# Patient Record
Sex: Female | Born: 1950 | Race: White | Hispanic: No | Marital: Married | Smoking: Never smoker
Health system: Southern US, Community
[De-identification: ages and names within clinical notes are randomized; demographics above are authoritative.]

## PROBLEM LIST (undated history)

## (undated) DIAGNOSIS — Z923 Personal history of irradiation: Secondary | ICD-10-CM

## (undated) DIAGNOSIS — C50919 Malignant neoplasm of unspecified site of unspecified female breast: Secondary | ICD-10-CM

## (undated) DIAGNOSIS — Z9221 Personal history of antineoplastic chemotherapy: Secondary | ICD-10-CM

## (undated) HISTORY — PX: HERNIA REPAIR: SHX51

## (undated) HISTORY — PX: CATARACT EXTRACTION: SUR2

## (undated) HISTORY — PX: BREAST EXCISIONAL BIOPSY: SUR124

## (undated) HISTORY — PX: ABDOMINAL HYSTERECTOMY: SHX81

---

## 1997-11-18 ENCOUNTER — Other Ambulatory Visit: Admission: RE | Admit: 1997-11-18 | Discharge: 1997-11-18 | Payer: Self-pay | Admitting: *Deleted

## 2000-04-04 ENCOUNTER — Other Ambulatory Visit: Admission: RE | Admit: 2000-04-04 | Discharge: 2000-04-04 | Payer: Self-pay | Admitting: Family Medicine

## 2000-04-25 ENCOUNTER — Ambulatory Visit (HOSPITAL_COMMUNITY): Admission: RE | Admit: 2000-04-25 | Discharge: 2000-04-25 | Payer: Self-pay | Admitting: *Deleted

## 2001-01-14 DIAGNOSIS — Z923 Personal history of irradiation: Secondary | ICD-10-CM

## 2001-01-14 DIAGNOSIS — Z9221 Personal history of antineoplastic chemotherapy: Secondary | ICD-10-CM

## 2001-01-14 HISTORY — DX: Personal history of antineoplastic chemotherapy: Z92.21

## 2001-01-14 HISTORY — DX: Personal history of irradiation: Z92.3

## 2001-07-08 ENCOUNTER — Other Ambulatory Visit: Admission: RE | Admit: 2001-07-08 | Discharge: 2001-07-08 | Payer: Self-pay | Admitting: *Deleted

## 2001-07-10 ENCOUNTER — Encounter: Payer: Self-pay | Admitting: Family Medicine

## 2001-07-10 ENCOUNTER — Encounter: Admission: RE | Admit: 2001-07-10 | Discharge: 2001-07-10 | Payer: Self-pay | Admitting: Family Medicine

## 2001-07-10 ENCOUNTER — Encounter (INDEPENDENT_AMBULATORY_CARE_PROVIDER_SITE_OTHER): Payer: Self-pay | Admitting: Specialist

## 2001-07-10 HISTORY — PX: BREAST BIOPSY: SHX20

## 2001-07-20 ENCOUNTER — Ambulatory Visit (HOSPITAL_BASED_OUTPATIENT_CLINIC_OR_DEPARTMENT_OTHER): Admission: RE | Admit: 2001-07-20 | Discharge: 2001-07-20 | Payer: Self-pay | Admitting: *Deleted

## 2001-07-20 ENCOUNTER — Encounter: Admission: RE | Admit: 2001-07-20 | Discharge: 2001-07-20 | Payer: Self-pay | Admitting: *Deleted

## 2001-07-20 ENCOUNTER — Encounter (INDEPENDENT_AMBULATORY_CARE_PROVIDER_SITE_OTHER): Payer: Self-pay | Admitting: Specialist

## 2001-07-20 DIAGNOSIS — C50919 Malignant neoplasm of unspecified site of unspecified female breast: Secondary | ICD-10-CM

## 2001-07-20 HISTORY — PX: BREAST LUMPECTOMY: SHX2

## 2001-07-20 HISTORY — DX: Malignant neoplasm of unspecified site of unspecified female breast: C50.919

## 2001-08-05 ENCOUNTER — Ambulatory Visit: Admission: RE | Admit: 2001-08-05 | Discharge: 2001-11-03 | Payer: Self-pay | Admitting: Radiation Oncology

## 2001-11-10 ENCOUNTER — Ambulatory Visit: Admission: RE | Admit: 2001-11-10 | Discharge: 2002-01-25 | Payer: Self-pay | Admitting: Radiation Oncology

## 2001-11-16 ENCOUNTER — Encounter: Payer: Self-pay | Admitting: Family Medicine

## 2001-11-16 ENCOUNTER — Encounter: Admission: RE | Admit: 2001-11-16 | Discharge: 2001-11-16 | Payer: Self-pay | Admitting: Family Medicine

## 2002-01-14 HISTORY — PX: BREAST BIOPSY: SHX20

## 2002-03-22 ENCOUNTER — Ambulatory Visit (HOSPITAL_BASED_OUTPATIENT_CLINIC_OR_DEPARTMENT_OTHER): Admission: RE | Admit: 2002-03-22 | Discharge: 2002-03-22 | Payer: Self-pay | Admitting: *Deleted

## 2002-03-22 ENCOUNTER — Encounter (INDEPENDENT_AMBULATORY_CARE_PROVIDER_SITE_OTHER): Payer: Self-pay | Admitting: *Deleted

## 2002-04-26 ENCOUNTER — Ambulatory Visit (HOSPITAL_BASED_OUTPATIENT_CLINIC_OR_DEPARTMENT_OTHER): Admission: RE | Admit: 2002-04-26 | Discharge: 2002-04-26 | Payer: Self-pay | Admitting: *Deleted

## 2002-04-27 ENCOUNTER — Encounter (INDEPENDENT_AMBULATORY_CARE_PROVIDER_SITE_OTHER): Payer: Self-pay | Admitting: *Deleted

## 2002-07-30 ENCOUNTER — Other Ambulatory Visit: Admission: RE | Admit: 2002-07-30 | Discharge: 2002-07-30 | Payer: Self-pay | Admitting: Internal Medicine

## 2002-11-19 ENCOUNTER — Encounter: Admission: RE | Admit: 2002-11-19 | Discharge: 2002-11-19 | Payer: Self-pay | Admitting: Oncology

## 2003-01-27 ENCOUNTER — Ambulatory Visit: Admission: RE | Admit: 2003-01-27 | Discharge: 2003-01-27 | Payer: Self-pay | Admitting: Radiation Oncology

## 2003-09-23 ENCOUNTER — Other Ambulatory Visit: Admission: RE | Admit: 2003-09-23 | Discharge: 2003-09-23 | Payer: Self-pay | Admitting: Internal Medicine

## 2003-11-25 ENCOUNTER — Encounter: Admission: RE | Admit: 2003-11-25 | Discharge: 2003-11-25 | Payer: Self-pay | Admitting: Internal Medicine

## 2004-02-16 HISTORY — PX: BREAST BIOPSY: SHX20

## 2004-03-09 ENCOUNTER — Ambulatory Visit (HOSPITAL_COMMUNITY): Admission: RE | Admit: 2004-03-09 | Discharge: 2004-03-09 | Payer: Self-pay | Admitting: *Deleted

## 2004-03-09 ENCOUNTER — Ambulatory Visit (HOSPITAL_BASED_OUTPATIENT_CLINIC_OR_DEPARTMENT_OTHER): Admission: RE | Admit: 2004-03-09 | Discharge: 2004-03-09 | Payer: Self-pay | Admitting: *Deleted

## 2004-03-09 ENCOUNTER — Encounter (INDEPENDENT_AMBULATORY_CARE_PROVIDER_SITE_OTHER): Payer: Self-pay | Admitting: Specialist

## 2004-05-02 ENCOUNTER — Ambulatory Visit: Payer: Self-pay | Admitting: Oncology

## 2004-11-06 ENCOUNTER — Ambulatory Visit: Payer: Self-pay | Admitting: Oncology

## 2004-11-28 ENCOUNTER — Encounter: Admission: RE | Admit: 2004-11-28 | Discharge: 2004-11-28 | Payer: Self-pay | Admitting: Internal Medicine

## 2004-12-14 ENCOUNTER — Other Ambulatory Visit: Admission: RE | Admit: 2004-12-14 | Discharge: 2004-12-14 | Payer: Self-pay | Admitting: Gynecology

## 2005-05-01 ENCOUNTER — Ambulatory Visit: Payer: Self-pay | Admitting: Oncology

## 2005-05-02 LAB — COMPREHENSIVE METABOLIC PANEL
ALT: 15 U/L (ref 0–40)
AST: 19 U/L (ref 0–37)
Albumin: 4.4 g/dL (ref 3.5–5.2)
Alkaline Phosphatase: 44 U/L (ref 39–117)
BUN: 22 mg/dL (ref 6–23)
CO2: 26 mEq/L (ref 19–32)
Calcium: 9.3 mg/dL (ref 8.4–10.5)
Chloride: 103 mEq/L (ref 96–112)
Creatinine, Ser: 0.7 mg/dL (ref 0.4–1.2)
Glucose, Bld: 92 mg/dL (ref 70–99)
Potassium: 4.1 mEq/L (ref 3.5–5.3)
Sodium: 138 mEq/L (ref 135–145)
Total Bilirubin: 0.6 mg/dL (ref 0.3–1.2)
Total Protein: 7.7 g/dL (ref 6.0–8.3)

## 2005-05-02 LAB — CBC WITH DIFFERENTIAL/PLATELET
BASO%: 0.4 % (ref 0.0–2.0)
Basophils Absolute: 0 10*3/uL (ref 0.0–0.1)
EOS%: 0.6 % (ref 0.0–7.0)
Eosinophils Absolute: 0 10*3/uL (ref 0.0–0.5)
HCT: 40.4 % (ref 34.8–46.6)
HGB: 13.4 g/dL (ref 11.6–15.9)
LYMPH%: 21.1 % (ref 14.0–48.0)
MCH: 29.5 pg (ref 26.0–34.0)
MCHC: 33.2 g/dL (ref 32.0–36.0)
MCV: 88.8 fL (ref 81.0–101.0)
MONO#: 0.5 10*3/uL (ref 0.1–0.9)
MONO%: 6 % (ref 0.0–13.0)
NEUT#: 5.4 10*3/uL (ref 1.5–6.5)
NEUT%: 71.9 % (ref 39.6–76.8)
Platelets: 306 10*3/uL (ref 145–400)
RBC: 4.55 10*6/uL (ref 3.70–5.32)
RDW: 14.8 % — ABNORMAL HIGH (ref 11.3–14.5)
WBC: 7.5 10*3/uL (ref 3.9–10.0)
lymph#: 1.6 10*3/uL (ref 0.9–3.3)

## 2005-07-24 ENCOUNTER — Inpatient Hospital Stay (HOSPITAL_COMMUNITY): Admission: RE | Admit: 2005-07-24 | Discharge: 2005-07-26 | Payer: Self-pay | Admitting: Obstetrics and Gynecology

## 2005-07-24 ENCOUNTER — Encounter (INDEPENDENT_AMBULATORY_CARE_PROVIDER_SITE_OTHER): Payer: Self-pay | Admitting: Specialist

## 2005-11-05 ENCOUNTER — Ambulatory Visit: Payer: Self-pay | Admitting: Oncology

## 2005-11-29 ENCOUNTER — Encounter: Admission: RE | Admit: 2005-11-29 | Discharge: 2005-11-29 | Payer: Self-pay | Admitting: Oncology

## 2006-05-02 ENCOUNTER — Ambulatory Visit: Payer: Self-pay | Admitting: Oncology

## 2006-05-07 LAB — COMPREHENSIVE METABOLIC PANEL
AST: 25 U/L (ref 0–37)
Albumin: 4.3 g/dL (ref 3.5–5.2)
Alkaline Phosphatase: 43 U/L (ref 39–117)
BUN: 16 mg/dL (ref 6–23)
Creatinine, Ser: 0.71 mg/dL (ref 0.40–1.20)
Glucose, Bld: 84 mg/dL (ref 70–99)
Potassium: 4.1 mEq/L (ref 3.5–5.3)
Total Bilirubin: 0.3 mg/dL (ref 0.3–1.2)

## 2006-05-07 LAB — CBC WITH DIFFERENTIAL/PLATELET
Basophils Absolute: 0 10*3/uL (ref 0.0–0.1)
EOS%: 3.2 % (ref 0.0–7.0)
Eosinophils Absolute: 0.2 10*3/uL (ref 0.0–0.5)
HCT: 36.2 % (ref 34.8–46.6)
HGB: 12.5 g/dL (ref 11.6–15.9)
LYMPH%: 27.9 % (ref 14.0–48.0)
MCH: 30.1 pg (ref 26.0–34.0)
MCV: 87.6 fL (ref 81.0–101.0)
MONO%: 6.9 % (ref 0.0–13.0)
NEUT#: 3.8 10*3/uL (ref 1.5–6.5)
NEUT%: 61.3 % (ref 39.6–76.8)
Platelets: 262 10*3/uL (ref 145–400)
RDW: 14.5 % (ref 11.3–14.5)

## 2006-11-11 ENCOUNTER — Ambulatory Visit: Payer: Self-pay | Admitting: Oncology

## 2006-11-13 LAB — CBC WITH DIFFERENTIAL/PLATELET
BASO%: 0.4 % (ref 0.0–2.0)
Basophils Absolute: 0 10e3/uL (ref 0.0–0.1)
EOS%: 1.5 % (ref 0.0–7.0)
Eosinophils Absolute: 0.1 10e3/uL (ref 0.0–0.5)
HCT: 37.7 % (ref 34.8–46.6)
HGB: 13.1 g/dL (ref 11.6–15.9)
LYMPH%: 22.5 % (ref 14.0–48.0)
MCH: 30.3 pg (ref 26.0–34.0)
MCHC: 34.7 g/dL (ref 32.0–36.0)
MCV: 87.4 fL (ref 81.0–101.0)
MONO#: 0.6 10e3/uL (ref 0.1–0.9)
MONO%: 9.2 % (ref 0.0–13.0)
NEUT#: 4.3 10e3/uL (ref 1.5–6.5)
NEUT%: 66.4 % (ref 39.6–76.8)
Platelets: 259 10e3/uL (ref 145–400)
RBC: 4.31 10e6/uL (ref 3.70–5.32)
RDW: 14.4 % (ref 11.3–14.5)
WBC: 6.5 10e3/uL (ref 3.9–10.0)
lymph#: 1.5 10e3/uL (ref 0.9–3.3)

## 2006-11-13 LAB — COMPREHENSIVE METABOLIC PANEL WITH GFR
ALT: 20 U/L (ref 0–35)
AST: 24 U/L (ref 0–37)
Albumin: 4.2 g/dL (ref 3.5–5.2)
Alkaline Phosphatase: 38 U/L — ABNORMAL LOW (ref 39–117)
BUN: 16 mg/dL (ref 6–23)
CO2: 24 meq/L (ref 19–32)
Calcium: 9.5 mg/dL (ref 8.4–10.5)
Chloride: 102 meq/L (ref 96–112)
Creatinine, Ser: 0.69 mg/dL (ref 0.40–1.20)
Glucose, Bld: 82 mg/dL (ref 70–99)
Potassium: 4.2 meq/L (ref 3.5–5.3)
Sodium: 138 meq/L (ref 135–145)
Total Bilirubin: 0.5 mg/dL (ref 0.3–1.2)
Total Protein: 7.4 g/dL (ref 6.0–8.3)

## 2006-12-03 ENCOUNTER — Encounter: Admission: RE | Admit: 2006-12-03 | Discharge: 2006-12-03 | Payer: Self-pay | Admitting: Oncology

## 2007-05-11 ENCOUNTER — Ambulatory Visit: Payer: Self-pay | Admitting: Oncology

## 2007-05-13 LAB — COMPREHENSIVE METABOLIC PANEL
ALT: 45 U/L — ABNORMAL HIGH (ref 0–35)
AST: 44 U/L — ABNORMAL HIGH (ref 0–37)
Albumin: 4.3 g/dL (ref 3.5–5.2)
Alkaline Phosphatase: 49 U/L (ref 39–117)
BUN: 23 mg/dL (ref 6–23)
CO2: 27 mEq/L (ref 19–32)
Calcium: 10 mg/dL (ref 8.4–10.5)
Chloride: 105 mEq/L (ref 96–112)
Creatinine, Ser: 0.72 mg/dL (ref 0.40–1.20)
Glucose, Bld: 82 mg/dL (ref 70–99)
Potassium: 4.1 mEq/L (ref 3.5–5.3)
Sodium: 141 mEq/L (ref 135–145)
Total Bilirubin: 0.4 mg/dL (ref 0.3–1.2)
Total Protein: 7.5 g/dL (ref 6.0–8.3)

## 2007-05-13 LAB — CBC WITH DIFFERENTIAL/PLATELET
BASO%: 0.3 % (ref 0.0–2.0)
Basophils Absolute: 0 10*3/uL (ref 0.0–0.1)
EOS%: 4.9 % (ref 0.0–7.0)
Eosinophils Absolute: 0.3 10*3/uL (ref 0.0–0.5)
HCT: 36.6 % (ref 34.8–46.6)
HGB: 12.6 g/dL (ref 11.6–15.9)
LYMPH%: 28.3 % (ref 14.0–48.0)
MCH: 30.1 pg (ref 26.0–34.0)
MCHC: 34.5 g/dL (ref 32.0–36.0)
MCV: 87.2 fL (ref 81.0–101.0)
MONO#: 0.4 10*3/uL (ref 0.1–0.9)
MONO%: 6.6 % (ref 0.0–13.0)
NEUT#: 3.8 10*3/uL (ref 1.5–6.5)
NEUT%: 59.9 % (ref 39.6–76.8)
Platelets: 312 10*3/uL (ref 145–400)
RBC: 4.2 10*6/uL (ref 3.70–5.32)
RDW: 14.7 % — ABNORMAL HIGH (ref 11.3–14.5)
WBC: 6.3 10*3/uL (ref 3.9–10.0)
lymph#: 1.8 10*3/uL (ref 0.9–3.3)

## 2007-12-04 ENCOUNTER — Encounter: Admission: RE | Admit: 2007-12-04 | Discharge: 2007-12-04 | Payer: Self-pay | Admitting: Family Medicine

## 2008-02-03 ENCOUNTER — Encounter: Admission: RE | Admit: 2008-02-03 | Discharge: 2008-02-03 | Payer: Self-pay | Admitting: Family Medicine

## 2008-06-14 ENCOUNTER — Ambulatory Visit: Payer: Self-pay | Admitting: Oncology

## 2008-06-16 LAB — COMPREHENSIVE METABOLIC PANEL
ALT: 21 U/L (ref 0–35)
AST: 24 U/L (ref 0–37)
Albumin: 4.2 g/dL (ref 3.5–5.2)
Alkaline Phosphatase: 50 U/L (ref 39–117)
BUN: 17 mg/dL (ref 6–23)
CO2: 27 mEq/L (ref 19–32)
Calcium: 9.3 mg/dL (ref 8.4–10.5)
Chloride: 104 mEq/L (ref 96–112)
Creatinine, Ser: 0.74 mg/dL (ref 0.40–1.20)
Glucose, Bld: 82 mg/dL (ref 70–99)
Potassium: 4.5 mEq/L (ref 3.5–5.3)
Sodium: 139 mEq/L (ref 135–145)
Total Bilirubin: 0.6 mg/dL (ref 0.3–1.2)
Total Protein: 7.4 g/dL (ref 6.0–8.3)

## 2008-06-16 LAB — CBC WITH DIFFERENTIAL/PLATELET
BASO%: 1 % (ref 0.0–2.0)
Basophils Absolute: 0.1 10*3/uL (ref 0.0–0.1)
EOS%: 2.9 % (ref 0.0–7.0)
Eosinophils Absolute: 0.2 10*3/uL (ref 0.0–0.5)
HCT: 40.9 % (ref 34.8–46.6)
HGB: 13.8 g/dL (ref 11.6–15.9)
LYMPH%: 31.6 % (ref 14.0–49.7)
MCH: 29.4 pg (ref 25.1–34.0)
MCHC: 33.7 g/dL (ref 31.5–36.0)
MCV: 87 fL (ref 79.5–101.0)
MONO#: 0.5 10*3/uL (ref 0.1–0.9)
MONO%: 8.8 % (ref 0.0–14.0)
NEUT#: 3.3 10*3/uL (ref 1.5–6.5)
NEUT%: 55.7 % (ref 38.4–76.8)
Platelets: 242 10*3/uL (ref 145–400)
RBC: 4.7 10*6/uL (ref 3.70–5.45)
RDW: 14.6 % — ABNORMAL HIGH (ref 11.2–14.5)
WBC: 5.9 10*3/uL (ref 3.9–10.3)
lymph#: 1.9 10*3/uL (ref 0.9–3.3)

## 2008-12-07 ENCOUNTER — Encounter: Admission: RE | Admit: 2008-12-07 | Discharge: 2008-12-07 | Payer: Self-pay | Admitting: Family Medicine

## 2009-08-02 ENCOUNTER — Ambulatory Visit: Payer: Self-pay | Admitting: Oncology

## 2009-12-08 ENCOUNTER — Encounter: Admission: RE | Admit: 2009-12-08 | Discharge: 2009-12-08 | Payer: Self-pay | Admitting: Family Medicine

## 2010-03-23 ENCOUNTER — Other Ambulatory Visit: Payer: Self-pay | Admitting: Gastroenterology

## 2010-06-01 NOTE — Op Note (Signed)
Teresa Terrell, [name]:  [id number]   MEDICAL RECORD NO.:  [id number]          PATIENT TYPE:  INP   LOCATION:  9399                          FACILITY:  [facility]   PHYSICIAN:  Gerald Leitz, MD          DATE OF BIRTH:  1950-05-21   DATE OF PROCEDURE:  07/24/2005  DATE OF DISCHARGE:                                 OPERATIVE REPORT   PREOPERATIVE DIAGNOSIS:  Complex atypical hyperplasia.   POSTOPERATIVE DIAGNOSIS:  Complex atypical hyperplasia.   PROCEDURE:  Total abdominal hysterectomy and bilateral salpingo-  oophorectomy.   SURGEON:  Gerald Leitz, MD   ASSISTANT:  Bing Neighbors. Delcambre, MD   ANESTHESIA:  General.   SPECIMEN:  Uterus, right and left fallopian tube and right and left ovary.  Sent to pathology   ESTIMATED BLOOD LOSS:  250 mL.   FLUIDS:  1800 100 mL of lactated Ringer's.   URINE OUTPUT:  225 mL of clear urine at the end of the procedure.   COMPLICATIONS:  None.   FINDINGS:  An 8 cm fibroid uterus.  Fallopian tubes appeared normal.  Patient with mesh from previous inguinal hernia repair noted in the right  lower quadrant involving the fascia.  Adhesions of the broad ligament to the  colon.   PROCEDURE:  The patient was taken to the operating room, where she was  placed under general anesthesia.  She was ten prepped and draped in the  usual sterile fashion.  A Pfannenstiel skin incision was made with the  scalpel and carried down to the underlying layer of fascia.  The fascia was  noted in the right lower quadrant to be compromised by mesh from a previous  inguinal hernia repair.  Incision in the fascia was made superior to the  mesh from hernia repair.  The incision was extended laterally with Mayo  scissors.  The inferior aspect of the fascial incision was grasped with  Kocher clamps, elevated, and the underlying rectus muscles were dissected  off.  The rectus muscles were separated in the midline.  The peritoneum was  identified and entered  sharply with Metzenbaum scissors and the peritoneal  incision was extended superiorly with Metzenbaum scissors and inferiorly  with good visualization of the bladder.  The abdomen was inspected with the  findings noted above.  A Balfour retractor was placed into the surgical  incision.  The bowel was packed away with moistened laparotomy sponges.  The  uterus was grasped at the cornua with Kelly clamps for traction.  The broad  ligaments were identified.  They were sutured with 0 Vicryl and then  ligated.  The broad ligament was entered with Metzenbaum scissors.  The  bladder flap was created with Metzenbaum scissors.  The bladder was  dissected off the lower uterine segment via a series of sharp and blunt  dissection.  The infundibulopelvic ligaments were identified bilaterally.  They were clamped with Heaney clamps and transected and then suture ligated  with 0 Vicryl.  Excellent hemostasis was noted.  The uterine arteries were  skeletonized  bilaterally.  Heaney clamps were applied to the uterine  arteries.  They were transected and suture ligated with 0 Vicryl.  The  cardinal ligaments and uterosacral ligaments were transected and suture  ligated with 0 Vicryl.  The uterus and cervix were amputated with Mayo  scissors.  The vaginal cuff angles were repaired with 0 Vicryl.  The vaginal  cuff was closed with running locked stitches of - Vicryl.  Excellent  hemostasis was noted.  All instruments were removed from the patient's  abdomen along with sponges.  The fascia was closed with 0 PDS in a running  fashion.  Scarpa fascia was reapproximated with a 2-0 Vicryl in interrupted  fashion.  The skin was closed with 4-0 Vicryl in a running fashion.  Sponge,  lap and needle counts were correct x2.  Ancef 2 g was given at the beginning  of the procedure.  The patient was awakened from anesthesia and taken to  recovery room in stable condition.      Gerald Leitz, MD  Electronically  Signed     TC/MEDQ  D:  07/24/2005  T:  07/24/2005  Job:  [id number]

## 2010-06-01 NOTE — H&P (Signed)
Teresa Terrell, Teresa [name]:  [id number]   MEDICAL RECORD NO.:  [id number]          PATIENT TYPE:  AMB   LOCATION:  SDC                           FACILITY:  [facility]   PHYSICIAN:  Gerald Leitz, MD          DATE OF BIRTH:  14-Oct-1950   DATE OF ADMISSION:  07/24/2005  DATE OF DISCHARGE:                                HISTORY & PHYSICAL   HISTORY OF PRESENT ILLNESS:  This is a 60 year old G4, P4 with complex  atypical endometrial hyperplasia on endometrial biopsy performed May 29, 2005.  The patient had been on tamoxifen therapy for the past 3 years  secondary to history of breast cancer diagnosed in July 2003.  She began  having abnormal uterine bleeding and underwent an endometrial biopsy in the  office May 29, 2005.   PAST OBSTETRICAL/GYNECOLOGICAL HISTORY:  _spontaneouus_ vaginal delivery x4.  No history of sexually transmitted diseases.  No history of abnormal Pap  smears.  Last Pap smear was on December 14, 2004 and this was normal.   PAST MEDICAL HISTORY:  Hypertension, plantar fasciitis, osteopenia, and  anemia.   PAST SURGICAL HISTORY:  Left open inguinal hernia repair.  Left breast  biopsy.  Right breast lumpectomy.  Umbilical hernia repair.  Laparoscopic  right inguinal hernia repair in 1986.   MEDICATIONS:  1.  Norvasc 5 mg one p.o. daily.  2.  Fosamax Plus one weekly.  3.  Feosol one daily.  4.  Calcium plus D one a day.  5.  Vitamin E 400 international units one daily.   ALLERGIES:  NO KNOWN DRUG ALLERGIES.   SOCIAL HISTORY:  The patient is married.  Denies tobacco, alcohol, or  illicit drug use.   FAMILY HISTORY:  Negative for breast, ovarian, and colon cancer; however,  the patient has a history of breast cancer.   PHYSICAL EXAMINATION:  VITAL SIGNS:  Blood pressure 116/82, heart rate 72,  weight 138-1/2 pounds, height is 5 feet 7/8 inch.  CARDIOVASCULAR:  Regular rate and rhythm.  LUNGS:  Clear to auscultation bilaterally.  ABDOMEN:  Soft,  nontender, nondistended.  No masses.  PELVIC EXAM:  Normal external female genitalia.  No vulva, vaginal, cervical  lesions noted.  Bimanual exam reveals right adnexal fullness.  Questionable  mass-like structure the patient states has been there for some time since  inguinal hernia repair.  No left adnexal masses or tenderness.   PATHOLOGY:  Endometrial biopsy shows atypical complex hyperplasia.   ASSESSMENT AND PLAN:  Atypical complex hyperplasia.  Given 30% risk of  progression to endometrial carcinoma, the patient was offered total  abdominal hysterectomy.  Given history of umbilical hernia repair as well as  left open inguinal hernia repair, she desires to have her ovaries removed  during the hysterectomy.  We will proceed with total abdominal hysterectomy  and bilateral salpingo-oophorectomy.  Risks, benefits, alternatives of  surgery were discussed with the patient including, but not limited to,  infection, bleeding, damage to bowel, bladder and surrounding organs with  the need for further surgery.  Risk of deep vein thrombosis and risk of  transfusion, human immunodeficiency virus, hepatitis B and C were also  discussed.  The patient understands and wishes to proceed with total  abdominal hysterectomy and bilateral salpingo-oophorectomy.      Gerald Leitz, MD  Electronically Signed     TC/MEDQ  D:  07/22/2005  T:  07/22/2005  Job:  [id number]

## 2010-06-01 NOTE — Discharge Summary (Signed)
NAMEMANASA, SPEASE NO.:  [id number]   MEDICAL RECORD NO.:  [id number]          PATIENT TYPE:  INP   LOCATION:  9308                          FACILITY:  [facility]   PHYSICIAN:  Gerald Leitz, MD          DATE OF BIRTH:  [birth year]   DATE OF ADMISSION:  07/24/2005  DATE OF DISCHARGE:  07/26/2005                                 DISCHARGE SUMMARY   DIAGNOSIS AT ADMISSION:  Complex atypical hyperplasia.   DISCHARGE DIAGNOSES:  1. Complex atypical hyperplasia.  2. Status post total abdominal hysterectomy and bilateral salpingo-      oophorectomy.   BRIEF HOSPITAL COURSE:  The patient was admitted on July 24, 2005, after  undergoing a total abdominal hysterectomy and bilateral salpingo-  oophorectomy secondary to complex atypical hyperplasia.  She did well  postoperatively.  Hemoglobin on postoperative day #1 was 12.2.  She was  discharged home in stable condition.  Activity ad lib.  Pelvic rest for 6  weeks.  She was discharged home on the following medications:  Motrin and Percocet.  She will follow up with Dr. Richardson Dopp in 2 weeks.      Gerald Leitz, MD  Electronically Signed     TC/MEDQ  D:  09/03/2005  T:  09/03/2005  Job:  [id number]

## 2010-08-15 ENCOUNTER — Encounter (HOSPITAL_BASED_OUTPATIENT_CLINIC_OR_DEPARTMENT_OTHER): Payer: BC Managed Care – PPO | Admitting: Oncology

## 2010-08-15 DIAGNOSIS — C50419 Malignant neoplasm of upper-outer quadrant of unspecified female breast: Secondary | ICD-10-CM

## 2010-08-15 DIAGNOSIS — Z170 Estrogen receptor positive status [ER+]: Secondary | ICD-10-CM

## 2010-11-13 ENCOUNTER — Other Ambulatory Visit: Payer: Self-pay | Admitting: Family Medicine

## 2010-11-13 DIAGNOSIS — Z1231 Encounter for screening mammogram for malignant neoplasm of breast: Secondary | ICD-10-CM

## 2010-12-12 ENCOUNTER — Ambulatory Visit
Admission: RE | Admit: 2010-12-12 | Discharge: 2010-12-12 | Disposition: A | Discharge status: Home / Self Care | Payer: BC Managed Care – PPO | Source: Ambulatory Visit | Attending: Family Medicine | Admitting: Family Medicine

## 2010-12-12 DIAGNOSIS — Z1231 Encounter for screening mammogram for malignant neoplasm of breast: Secondary | ICD-10-CM

## 2011-01-25 ENCOUNTER — Emergency Department (HOSPITAL_BASED_OUTPATIENT_CLINIC_OR_DEPARTMENT_OTHER)
Admission: EM | Admit: 2011-01-25 | Discharge: 2011-01-25 | Disposition: A | Discharge status: Home / Self Care | Payer: BC Managed Care – PPO | Attending: Emergency Medicine | Admitting: Emergency Medicine

## 2011-01-25 ENCOUNTER — Emergency Department (INDEPENDENT_AMBULATORY_CARE_PROVIDER_SITE_OTHER): Payer: BC Managed Care – PPO

## 2011-01-25 ENCOUNTER — Encounter (HOSPITAL_BASED_OUTPATIENT_CLINIC_OR_DEPARTMENT_OTHER): Payer: Self-pay | Admitting: *Deleted

## 2011-01-25 DIAGNOSIS — Y92009 Unspecified place in unspecified non-institutional (private) residence as the place of occurrence of the external cause: Secondary | ICD-10-CM | POA: Insufficient documentation

## 2011-01-25 DIAGNOSIS — M7989 Other specified soft tissue disorders: Secondary | ICD-10-CM

## 2011-01-25 DIAGNOSIS — S82899A Other fracture of unspecified lower leg, initial encounter for closed fracture: Secondary | ICD-10-CM

## 2011-01-25 DIAGNOSIS — W19XXXA Unspecified fall, initial encounter: Secondary | ICD-10-CM

## 2011-01-25 DIAGNOSIS — X500XXA Overexertion from strenuous movement or load, initial encounter: Secondary | ICD-10-CM | POA: Insufficient documentation

## 2011-01-25 DIAGNOSIS — M79609 Pain in unspecified limb: Secondary | ICD-10-CM

## 2011-01-25 DIAGNOSIS — S82839A Other fracture of upper and lower end of unspecified fibula, initial encounter for closed fracture: Secondary | ICD-10-CM

## 2011-01-25 NOTE — ED Provider Notes (Signed)
History     CSN: [id number]  Arrival date & time 01/25/11  0840   First MD Initiated Contact with Patient 01/25/11 [phone]      Chief Complaint  Patient presents with  . Ankle Pain    (Consider location/radiation/quality/duration/timing/severity/associated sxs/prior treatment) Patient is a 61 y.o. female presenting with ankle pain. The history is provided by the patient.  Ankle Pain  The incident occurred 1 to 2 hours ago. The incident occurred at home. Pertinent negatives include no numbness.   patient stepped off a last up and twisted her right ankle. She has pain laterally. No other injury. No numbness weakness. She states she only has pain when she walks on it. Skin is intact  History reviewed. No pertinent past medical history.  History reviewed. No pertinent past surgical history.  History reviewed. No pertinent family history.  History  Substance Use Topics  . Smoking status: Not on file  . Smokeless tobacco: Not on file  . Alcohol Use: Not on file    OB History    Grav Para Term Preterm Abortions TAB SAB Ect Mult Living                  Review of Systems  Respiratory: Negative for shortness of breath.   Cardiovascular: Negative for chest pain.  Musculoskeletal: Positive for joint swelling and gait problem. Negative for back pain.  Skin: Negative for pallor, rash and wound.  Neurological: Negative for weakness and numbness.    Allergies  Review of patient's allergies indicates no known allergies.  Home Medications  No current outpatient prescriptions on file.  BP 150/87  Pulse 92  Temp(Src) 98.2 F (36.8 C) (Oral)  Ht 5' (1.524 m)  Wt 136 lb (61.689 kg)  BMI 26.56 kg/m2  SpO2 98%  Physical Exam  Constitutional: She appears well-developed.  Musculoskeletal:       Tenderness and swelling over lateral malleolus of right ankle. Skin is intact. No tenderness over fifth metacarpal. No tenderness over the fibula. Dorsalis pedis pulse intact.    Neurological: She is alert.  Skin: Skin is warm and dry. No erythema.    ED Course  Procedures (including critical care time)  Labs Reviewed - No data to display Dg Ankle Complete Right  01/25/2011  *RADIOLOGY REPORT*  Clinical Data: Fall, pain and swelling  RIGHT ANKLE - COMPLETE 3+ VIEW  Comparison: None.  Findings: Acute transverse fracture with minimal displacement of the right lateral malleolus.  Distal tibia, talus and calcaneus intact.  Plantar calcaneal spurring evident.  Lateral swelling noted.  IMPRESSION: Acute fracture right lateral malleolus.  Original Report Authenticated By: Judie Petit. Ruel Favors, M.D.     1. Fracture of distal fibula       MDM  Right ankle injury. Distal fibular fracture. Mortise appears intact. No tenderness over the fibula. Patient was put in a Cam Walker he'll followup with sports medicine or orthopedics.        Juliet Rude. Rubin Payor, MD 01/25/11 [phone]

## 2011-01-25 NOTE — ED Notes (Signed)
Pt to room 6 in w/c, reports missing last step and twisting her left ankle this am. Denies any other injuries or head trauma.

## 2011-01-31 ENCOUNTER — Ambulatory Visit (INDEPENDENT_AMBULATORY_CARE_PROVIDER_SITE_OTHER): Payer: BC Managed Care – PPO | Admitting: Family Medicine

## 2011-01-31 ENCOUNTER — Encounter: Payer: Self-pay | Admitting: Family Medicine

## 2011-01-31 VITALS — BP 149/93 | HR 71 | Temp 98.1°F | Ht 60.0 in | Wt 136.0 lb

## 2011-01-31 DIAGNOSIS — S99911A Unspecified injury of right ankle, initial encounter: Secondary | ICD-10-CM

## 2011-01-31 DIAGNOSIS — S99919A Unspecified injury of unspecified ankle, initial encounter: Secondary | ICD-10-CM

## 2011-01-31 NOTE — Patient Instructions (Signed)
You have a distal fibula avulsion fracture. These heal up really well with conservative therapy. Wear the walking boot when up and walking around at all times - ok to take off to ice, elevate, and shower only but be careful. Ice for 15 minutes at a time 3-4 times a day. ACE wrap for compression from toes up to mid-calf. Elevation above heart to help with swelling as well. Follow up with me in 2 weeks - we will repeat x-rays. Anticipate this taking about 6 weeks to totally heal clinically.

## 2011-02-01 ENCOUNTER — Encounter: Payer: Self-pay | Admitting: Family Medicine

## 2011-02-01 DIAGNOSIS — S99911A Unspecified injury of right ankle, initial encounter: Secondary | ICD-10-CM | POA: Insufficient documentation

## 2011-02-01 NOTE — Assessment & Plan Note (Signed)
Right ankle distal fibula avulsion fracture - Weber A type - should heal well with conservative care, walking boot for total of 6 weeks.  ACE wrap, icing, elevation.  Crutches if needed (declined these).  Continue ibuprofen, tylenol as needed for pain - she declined other medications for this.  F/u in 2 weeks for repeat x-rays and evaluation.

## 2011-02-01 NOTE — Progress Notes (Signed)
  Subjective:    Patient ID: Teresa Terrell, female    DOB: May 20, 1950, 61 y.o.   MRN: [id number]  PCP: Sigmund Hazel MD  HPI 61 yo F here for right ankle injury.  Patient reports she was walking down steps on 1/11 when she missed the last step, believes she inverted right ankle and fell hard onto the ground. Couldn't bear weight immediately following this and went to ED. + swelling and bruising. Has been taking ibuprofen. No prior ankle injuries. X-rays showed a distal fibula avulsion fracture - placed in walking boot and referred here.  History reviewed. No pertinent past medical history.  No current outpatient prescriptions on file prior to visit.    Past Surgical History  Procedure Date  . Cataract extraction   . Abdominal hysterectomy   . Breast lumpectomy   . Hernia repair     No Known Allergies  History   Social History  . Marital Status: Married    Spouse Name: N/A    Number of Children: N/A  . Years of Education: N/A   Occupational History  . Not on file.   Social History Main Topics  . Smoking status: Never Smoker   . Smokeless tobacco: Not on file  . Alcohol Use: Not on file  . Drug Use: Not on file  . Sexually Active: Not on file   Other Topics Concern  . Not on file   Social History Narrative  . No narrative on file    Family History  Problem Relation Age of Onset  . Hypertension Father   . Hyperlipidemia Neg Hx   . Heart attack Neg Hx   . Diabetes Neg Hx   . Sudden death Neg Hx     BP 149/93  Pulse 71  Temp(Src) 98.1 F (36.7 C) (Oral)  Ht 5' (1.524 m)  Wt 136 lb (61.689 kg)  BMI 26.56 kg/m2  Review of Systems See HPI above.    Objective:   Physical Exam Gen: NAD  R ankle: Moderate swelling and bruising especially lateral aspect of ankle up to mid-lower leg.  No other deformity. Mod limitation of motion in all planes but able to move in all planes. TTP lateral malleolus.  No TTP medial malleolus, talar dome, base 5th,  navicular, fibular head, elsewhere about foot or ankle. Ant drawer and talar tilt deferred with known fracture.  Positive syndesmotic compression. Thompsons test negative. NV intact distally.    Assessment & Plan:  1. Right ankle distal fibula avulsion fracture - Weber A type - should heal well with conservative care, walking boot for total of 6 weeks.  ACE wrap, icing, elevation.  Crutches if needed (declined these).  Continue ibuprofen, tylenol as needed for pain - she declined other medications for this.  F/u in 2 weeks for repeat x-rays and evaluation.

## 2011-02-14 ENCOUNTER — Ambulatory Visit: Payer: BC Managed Care – PPO | Admitting: Family Medicine

## 2011-02-15 ENCOUNTER — Encounter: Payer: Self-pay | Admitting: Family Medicine

## 2011-02-15 ENCOUNTER — Ambulatory Visit (HOSPITAL_BASED_OUTPATIENT_CLINIC_OR_DEPARTMENT_OTHER)
Admission: RE | Admit: 2011-02-15 | Discharge: 2011-02-15 | Disposition: A | Discharge status: Home / Self Care | Payer: BC Managed Care – PPO | Source: Ambulatory Visit | Attending: Family Medicine | Admitting: Family Medicine

## 2011-02-15 ENCOUNTER — Ambulatory Visit (INDEPENDENT_AMBULATORY_CARE_PROVIDER_SITE_OTHER): Payer: BC Managed Care – PPO | Admitting: Family Medicine

## 2011-02-15 VITALS — BP 146/87 | HR 76 | Temp 97.4°F | Ht 60.0 in | Wt 135.0 lb

## 2011-02-15 DIAGNOSIS — S8990XA Unspecified injury of unspecified lower leg, initial encounter: Secondary | ICD-10-CM

## 2011-02-15 DIAGNOSIS — M25579 Pain in unspecified ankle and joints of unspecified foot: Secondary | ICD-10-CM

## 2011-02-15 DIAGNOSIS — Z09 Encounter for follow-up examination after completed treatment for conditions other than malignant neoplasm: Secondary | ICD-10-CM

## 2011-02-15 DIAGNOSIS — S99911A Unspecified injury of right ankle, initial encounter: Secondary | ICD-10-CM

## 2011-02-15 DIAGNOSIS — M25571 Pain in right ankle and joints of right foot: Secondary | ICD-10-CM

## 2011-02-15 DIAGNOSIS — Z4789 Encounter for other orthopedic aftercare: Secondary | ICD-10-CM | POA: Insufficient documentation

## 2011-02-15 NOTE — Progress Notes (Signed)
  Subjective:    Patient ID: Teresa Terrell, female    DOB: 1950-01-22, 61 y.o.   MRN: [id number]  PCP: Sigmund Hazel MD  HPI  61 yo F here for f/u right ankle injury.  1/17: Patient reports she was walking down steps on 1/11 when she missed the last step, believes she inverted right ankle and fell hard onto the ground. Couldn't bear weight immediately following this and went to ED. + swelling and bruising. Has been taking ibuprofen. No prior ankle injuries. X-rays showed a distal fibula avulsion fracture - placed in walking boot and referred here.  2/1: Patient doing much better since last visit. Now 3 weeks out from distal fibula avulsion fracture. Pain and swelling much better. Using cam walker, icing, and ACE wrap to ankle. Has been elevating also.  History reviewed. No pertinent past medical history.  Current Outpatient Prescriptions on File Prior to Visit  Medication Sig Dispense Refill  . aspirin 81 MG chewable tablet Chew 81 mg by mouth daily.      . Calcium Carbonate (CALCIUM 600 PO) Take by mouth.      . cholecalciferol (VITAMIN D) 400 UNITS TABS Take by mouth.        Past Surgical History  Procedure Date  . Cataract extraction   . Abdominal hysterectomy   . Breast lumpectomy   . Hernia repair     No Known Allergies  History   Social History  . Marital Status: Married    Spouse Name: N/A    Number of Children: N/A  . Years of Education: N/A   Occupational History  . Not on file.   Social History Main Topics  . Smoking status: Never Smoker   . Smokeless tobacco: Not on file  . Alcohol Use: Not on file  . Drug Use: Not on file  . Sexually Active: Not on file   Other Topics Concern  . Not on file   Social History Narrative  . No narrative on file    Family History  Problem Relation Age of Onset  . Hypertension Father   . Hyperlipidemia Neg Hx   . Heart attack Neg Hx   . Diabetes Neg Hx   . Sudden death Neg Hx     BP 146/87  Pulse 76   Temp(Src) 97.4 F (36.3 C) (Oral)  Ht 5' (1.524 m)  Wt 135 lb (61.236 kg)  BMI 26.37 kg/m2  Review of Systems  See HPI above.    Objective:   Physical Exam  Gen: NAD  R ankle: Mild swelling lateral ankle.  Bruising still only in 3rd and 4th toes.  No other deformity. FROM all planes - pain with ext rotation. Mild TTP lateral malleolus.  No TTP medial malleolus, talar dome, base 5th, navicular, fibular head, elsewhere about foot or ankle. Ant drawer and talar tilt deferred with known fracture.  Thompsons test negative. NV intact distally.    Assessment & Plan:  1. Right ankle distal fibula avulsion fracture - Weber A type - repeat x-rays without additional displacement and mild healing.  Clinically much improved as expected.  Should be completely improved in 3 weeks.  Cam walker for at least 1 more week then can transition to tennis/running shoes if feels comfortable in these.  Icing, ace wrap, elevation.  Tylenol as needed.  F/u in 3 weeks for x-rays, repeat evaluation.

## 2011-02-15 NOTE — Patient Instructions (Signed)
You have a distal fibula avulsion fracture. Your x-rays look great and this is healing up very well. Use the walking boot for another 1 week as you have been. Then if you feel comfortable you can switch to running/tennis shoes for the next 2 weeks (no flats, heels until at least 2/22). Ice for 15 minutes at a time 3-4 times a day as needed for pain/swelling.Marland Kitchen ACE wrap for compression from toes up to mid-calf if needed for swelling. Elevation above heart to help with swelling as well. Follow up with me in 3 weeks - we will repeat x-rays and anticipate this being your last visit.

## 2011-02-15 NOTE — Assessment & Plan Note (Signed)
Right ankle distal fibula avulsion fracture - Weber A type - repeat x-rays without additional displacement and mild healing.  Clinically much improved as expected.  Should be completely improved in 3 weeks.  Cam walker for at least 1 more week then can transition to tennis/running shoes if feels comfortable in these.  Icing, ace wrap, elevation.  Tylenol as needed.  F/u in 3 weeks for x-rays, repeat evaluation.

## 2011-03-08 ENCOUNTER — Ambulatory Visit: Payer: BC Managed Care – PPO | Admitting: Family Medicine

## 2011-03-08 ENCOUNTER — Ambulatory Visit (HOSPITAL_BASED_OUTPATIENT_CLINIC_OR_DEPARTMENT_OTHER)
Admission: RE | Admit: 2011-03-08 | Discharge: 2011-03-08 | Disposition: A | Discharge status: Home / Self Care | Payer: BC Managed Care – PPO | Source: Ambulatory Visit | Attending: Family Medicine | Admitting: Family Medicine

## 2011-03-08 ENCOUNTER — Encounter: Payer: Self-pay | Admitting: Family Medicine

## 2011-03-08 ENCOUNTER — Ambulatory Visit (INDEPENDENT_AMBULATORY_CARE_PROVIDER_SITE_OTHER): Payer: BC Managed Care – PPO | Admitting: Family Medicine

## 2011-03-08 VITALS — BP 146/84 | HR 68 | Temp 97.4°F | Ht 60.0 in | Wt 132.0 lb

## 2011-03-08 DIAGNOSIS — S99911A Unspecified injury of right ankle, initial encounter: Secondary | ICD-10-CM

## 2011-03-08 DIAGNOSIS — S82899A Other fracture of unspecified lower leg, initial encounter for closed fracture: Secondary | ICD-10-CM

## 2011-03-08 DIAGNOSIS — X58XXXA Exposure to other specified factors, initial encounter: Secondary | ICD-10-CM

## 2011-03-08 DIAGNOSIS — IMO0001 Reserved for inherently not codable concepts without codable children: Secondary | ICD-10-CM | POA: Insufficient documentation

## 2011-03-08 DIAGNOSIS — Z09 Encounter for follow-up examination after completed treatment for conditions other than malignant neoplasm: Secondary | ICD-10-CM

## 2011-03-08 DIAGNOSIS — S99929A Unspecified injury of unspecified foot, initial encounter: Secondary | ICD-10-CM

## 2011-03-08 DIAGNOSIS — M25571 Pain in right ankle and joints of right foot: Secondary | ICD-10-CM

## 2011-03-08 DIAGNOSIS — S8990XA Unspecified injury of unspecified lower leg, initial encounter: Secondary | ICD-10-CM

## 2011-03-08 DIAGNOSIS — M25579 Pain in unspecified ankle and joints of unspecified foot: Secondary | ICD-10-CM

## 2011-03-08 MED ORDER — NITROGLYCERIN 0.2 MG/HR TD PT24: MEDICATED_PATCH | TRANSDERMAL | Status: DC

## 2011-03-08 NOTE — Patient Instructions (Signed)
Your x-rays show this fracture has not completely healed now 6 weeks out from your fracture - I would expect to see more healing than this by this stage. 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 We have a few options at this stage: 1. Nitro patches to help improve blood supply to fracture site in hopes of healing. 2. Bone stimulator - we will check to see if this would be covered by your insurance company 3. Orthopedic surgeon consultation to discuss surgical intervention. I would go ahead with #s 1 and 2 now - if in a month you are having pain limiting your activities, would then consider #3. Follow up with me in 1 month for reevaluation.

## 2011-03-08 NOTE — Progress Notes (Signed)
Subjective:    Patient ID: Teresa Terrell, female    DOB: 1950-05-22, 61 y.o.   MRN: [id number]  PCP: Sigmund Hazel MD  HPI  61 yo F here for f/u right ankle injury.  1/17: Patient reports she was walking down steps on 1/11 when she missed the last step, believes she inverted right ankle and fell hard onto the ground. Couldn't bear weight immediately following this and went to ED. + swelling and bruising. Has been taking ibuprofen. No prior ankle injuries. X-rays showed a distal fibula avulsion fracture - placed in walking boot and referred here.  2/1: Patient doing much better since last visit. Now 3 weeks out from distal fibula avulsion fracture. Pain and swelling much better. Using cam walker, icing, and ACE wrap to ankle. Has been elevating also.  2/22: Overall patient feels clinically improved from last visit. Now using a comfortable shoe. Still gets sharp pains on outside of ankle. Occasionally taking ibuprofen. Some swelling.  History reviewed. No pertinent past medical history.  Current Outpatient Prescriptions on File Prior to Visit  Medication Sig Dispense Refill  . aspirin 81 MG chewable tablet Chew 81 mg by mouth daily.      . Calcium Carbonate (CALCIUM 600 PO) Take by mouth.      . cholecalciferol (VITAMIN D) 400 UNITS TABS Take by mouth.        Past Surgical History  Procedure Date  . Cataract extraction   . Abdominal hysterectomy   . Breast lumpectomy   . Hernia repair     No Known Allergies  History   Social History  . Marital Status: Married    Spouse Name: N/A    Number of Children: N/A  . Years of Education: N/A   Occupational History  . Not on file.   Social History Main Topics  . Smoking status: Never Smoker   . Smokeless tobacco: Not on file  . Alcohol Use: Not on file  . Drug Use: Not on file  . Sexually Active: Not on file   Other Topics Concern  . Not on file   Social History Narrative  . No narrative on file    Family  History  Problem Relation Age of Onset  . Hypertension Father   . Hyperlipidemia Neg Hx   . Heart attack Neg Hx   . Diabetes Neg Hx   . Sudden death Neg Hx     BP 146/84  Pulse 68  Temp(Src) 97.4 F (36.3 C) (Oral)  Ht 5' (1.524 m)  Wt 132 lb (59.875 kg)  BMI 25.78 kg/m2  Review of Systems  See HPI above.    Objective:   Physical Exam  Gen: NAD  R ankle: Mild swelling lateral ankle.  No longer with bruising.  No deformity. FROM all planes - minimal pain with ext rotation, 5/5 strength all directions. Minimal TTP lateral malleolus.  No TTP medial malleolus, talar dome, base 5th, navicular, fibular head, elsewhere about foot or ankle. Ant drawer and talar tilt negative.  Thompsons test negative. NV intact distally.  MSK u/s: Fracture line evident lateral malleolus without significant callus formation.  Distal lateral malleolus with neovascularity but no apparent neovascularity at fracture site.      Assessment & Plan:  1. Right ankle distal fibula avulsion fracture - Weber A type - now 6 weeks out from injury and though she is clinically improved (though not completely), radiographs and ultrasound still show fracture line without callus formation.  Discussed options at  this point.  Will start nitro patches today to increase blood flow to area and attempt to get bone stimulator covered to help accelerate bone healing and hopefully prevent need for surgical intervention.  F/u in 1 month for reevaluation.  We discussed possibility of fibrous non-union also as clinically she has improved.  Would consider surgical referral if she does not continue to show improvement with above measures.  Continue walking in supportive shoe or cam walker.  Tylenol as needed.

## 2011-03-08 NOTE — Assessment & Plan Note (Signed)
Right ankle distal fibula avulsion fracture - Weber A type - now 6 weeks out from injury and though she is clinically improved (though not completely), radiographs and ultrasound still show fracture line without callus formation.  Discussed options at this point.  Will start nitro patches today to increase blood flow to area and attempt to get bone stimulator covered to help accelerate bone healing and hopefully prevent need for surgical intervention.  F/u in 1 month for reevaluation.  We discussed possibility of fibrous non-union also as clinically she has improved.  Would consider surgical referral if she does not continue to show improvement with above measures.  Continue walking in supportive shoe or cam walker.  Tylenol as needed.

## 2011-03-12 ENCOUNTER — Ambulatory Visit: Payer: BC Managed Care – PPO | Admitting: Family Medicine

## 2011-04-05 ENCOUNTER — Ambulatory Visit (INDEPENDENT_AMBULATORY_CARE_PROVIDER_SITE_OTHER): Payer: BC Managed Care – PPO | Admitting: Family Medicine

## 2011-04-05 ENCOUNTER — Encounter: Payer: Self-pay | Admitting: Family Medicine

## 2011-04-05 VITALS — BP 140/84 | HR 69 | Temp 97.8°F | Ht 60.0 in | Wt 129.0 lb

## 2011-04-05 DIAGNOSIS — S99911A Unspecified injury of right ankle, initial encounter: Secondary | ICD-10-CM

## 2011-04-05 DIAGNOSIS — S8990XA Unspecified injury of unspecified lower leg, initial encounter: Secondary | ICD-10-CM

## 2011-04-05 NOTE — Patient Instructions (Signed)
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 Continue nitro patches for another 6 weeks. We'll contact the rep for the bone stimulator to inquire about this. Start back on your walking regimen - 10 minutes first day, increase by 5 minutes every other day. Know that if you are having issues despite conservative therapy, the next step would be surgical fixation at the fracture site. Follow up with me as needed.

## 2011-04-05 NOTE — Assessment & Plan Note (Signed)
Right ankle distal fibula avulsion fracture - Weber A type - now 10 weeks out from injury and is clinically improved with rare sharp pains lateral malleolus that are not limiting her activities.  Fracture line is still evident on ultrasound.  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  Advised to discontinue boot and resume her normal activities (slowly increase walking for exercise).  If she worsens we again discussed surgical referral, considering bone stimulator (would be approved after 4/11 by insurance).  Continue nitro patches for 6 more weeks.

## 2011-04-05 NOTE — Progress Notes (Signed)
Subjective:    Patient ID: Teresa Terrell, female    DOB: 06-Feb-1950, 61 y.o.   MRN: [id number]  PCP: Sigmund Hazel MD  HPI  61 yo F here for f/u right ankle injury.  1/17: Patient reports she was walking down steps on 1/11 when she missed the last step, believes she inverted right ankle and fell hard onto the ground. Couldn't bear weight immediately following this and went to ED. + swelling and bruising. Has been taking ibuprofen. No prior ankle injuries. X-rays showed a distal fibula avulsion fracture - placed in walking boot and referred here.  2/1: Patient doing much better since last visit. Now 62 weeks out from distal fibula avulsion fracture. Pain and swelling much better. Using cam walker, icing, and ACE wrap to ankle. Has been elevating also.  2/22: Overall patient feels clinically improved from last visit. Now using a comfortable shoe. Still gets sharp pains on outside of ankle. Occasionally taking ibuprofen. Some swelling.  3/22: Patient reports pain has essentially resolved except for occasional sharp pains laterally. No longer sore to press on the area. Has been wearing boot for protection. Not taking any oral medicines anymore. Is using nitro patches and no side effects. Swelling improved. Did not hear about a bone stimulator - called drug rep who stated she would need to be more than 90 days out for this to be covered by her insurance.  History reviewed. No pertinent past medical history.  Current Outpatient Prescriptions on File Prior to Visit  Medication Sig Dispense Refill  . alendronate (FOSAMAX) 70 MG tablet       . aspirin 81 MG chewable tablet Chew 81 mg by mouth daily.      . Calcium Carbonate (CALCIUM 600 PO) Take by mouth.      . cholecalciferol (VITAMIN D) 400 UNITS TABS Take by mouth.      . nitroGLYCERIN (NITRODUR - DOSED IN MG/24 HR) 0.2 mg/hr 1/4 patch over affected area - change this daily  30 patch  1    Past Surgical History  Procedure  Date  . Cataract extraction   . Abdominal hysterectomy   . Breast lumpectomy   . Hernia repair     No Known Allergies  History   Social History  . Marital Status: Married    Spouse Name: N/A    Number of Children: N/A  . Years of Education: N/A   Occupational History  . Not on file.   Social History Main Topics  . Smoking status: Never Smoker   . Smokeless tobacco: Not on file  . Alcohol Use: Not on file  . Drug Use: Not on file  . Sexually Active: Not on file   Other Topics Concern  . Not on file   Social History Narrative  . No narrative on file    Family History  Problem Relation Age of Onset  . Hypertension Father   . Hyperlipidemia Neg Hx   . Heart attack Neg Hx   . Diabetes Neg Hx   . Sudden death Neg Hx     BP 140/84  Pulse 69  Temp(Src) 97.8 F (36.6 C) (Oral)  Ht 5' (1.524 m)  Wt 129 lb (58.514 kg)  BMI 25.19 kg/m2  Review of Systems  See HPI above.    Objective:   Physical Exam  Gen: NAD  R ankle: Minimal swelling lateral ankle.  No bruising.  No deformity. FROM all planes with out pain, 5/5 strength all directions. No TTP  lateral malleolus.  No TTP medial malleolus, talar dome, base 5th, navicular, fibular head, elsewhere about foot or ankle. Ant drawer and talar tilt negative.  Thompsons test negative. NV intact distally.  MSK u/s: Fracture line still evident lateral malleolus without significant callus formation.  No neovascularity, minimal overlying edema.     Assessment & Plan:  1. Right ankle distal fibula avulsion fracture - Weber A type - now 10 weeks out from injury and is clinically improved with rare sharp pains lateral malleolus that are not limiting her activities.  Fracture line is still evident on ultrasound.  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   Advised to discontinue boot and resume her normal activities (slowly increase walking for exercise).  If she worsens we again discussed surgical referral, considering bone stimulator (would be approved after 4/11 by insurance).  Continue nitro patches for 6 more weeks.

## 2011-05-16 ENCOUNTER — Ambulatory Visit (INDEPENDENT_AMBULATORY_CARE_PROVIDER_SITE_OTHER): Payer: BC Managed Care – PPO | Admitting: Family Medicine

## 2011-05-16 ENCOUNTER — Encounter: Payer: Self-pay | Admitting: Family Medicine

## 2011-05-16 VITALS — BP 128/86 | HR 88 | Temp 98.3°F | Ht 60.0 in | Wt 126.0 lb

## 2011-05-16 DIAGNOSIS — S99911A Unspecified injury of right ankle, initial encounter: Secondary | ICD-10-CM

## 2011-05-16 DIAGNOSIS — S8990XA Unspecified injury of unspecified lower leg, initial encounter: Secondary | ICD-10-CM

## 2011-05-21 ENCOUNTER — Encounter: Payer: Self-pay | Admitting: Family Medicine

## 2011-05-21 NOTE — Progress Notes (Signed)
Subjective:    Patient ID: Teresa Terrell, female    DOB: 1950/02/22, 61 y.o.   MRN: [id number]  PCP: Sigmund Hazel MD  HPI  61 yo F here for f/u right ankle injury.  1/17: Patient reports she was walking down steps on 1/11 when she missed the last step, believes she inverted right ankle and fell hard onto the ground. Couldn't bear weight immediately following this and went to ED. + swelling and bruising. Has been taking ibuprofen. No prior ankle injuries. X-rays showed a distal fibula avulsion fracture - placed in walking boot and referred here.  2/1: Patient doing much better since last visit. Now 3 weeks out from distal fibula avulsion fracture. Pain and swelling much better. Using cam walker, icing, and ACE wrap to ankle. Has been elevating also.  2/22: Overall patient feels clinically improved from last visit. Now using a comfortable shoe. Still gets sharp pains on outside of ankle. Occasionally taking ibuprofen. Some swelling.  3/22: Patient reports pain has essentially resolved except for occasional sharp pains laterally. No longer sore to press on the area. Has been wearing boot for protection. Not taking any oral medicines anymore. Is using nitro patches and no side effects. Swelling improved. Did not hear about a bone stimulator - called drug rep who stated she would need to be more than 90 days out for this to be covered by her insurance.  5/2: Patient reports she is doing well overall. She gets occasional pains lateral right ankle around fracture site but they are tolerable. These pains do not limit her though she's been afraid to really increase her activities. She has continued wearing the nitro patch but last day was today. No limping. No swelling.  History reviewed. No pertinent past medical history.  Current Outpatient Prescriptions on File Prior to Visit  Medication Sig Dispense Refill  . alendronate (FOSAMAX) 70 MG tablet       . aspirin 81 MG  chewable tablet Chew 81 mg by mouth daily.      . Calcium Carbonate (CALCIUM 600 PO) Take by mouth.      . cholecalciferol (VITAMIN D) 400 UNITS TABS Take by mouth.      . nitroGLYCERIN (NITRODUR - DOSED IN MG/24 HR) 0.2 mg/hr 1/4 patch over affected area - change this daily  30 patch  1    Past Surgical History  Procedure Date  . Cataract extraction   . Abdominal hysterectomy   . Breast lumpectomy   . Hernia repair     No Known Allergies  History   Social History  . Marital Status: Married    Spouse Name: N/A    Number of Children: N/A  . Years of Education: N/A   Occupational History  . Not on file.   Social History Main Topics  . Smoking status: Never Smoker   . Smokeless tobacco: Not on file  . Alcohol Use: Not on file  . Drug Use: Not on file  . Sexually Active: Not on file   Other Topics Concern  . Not on file   Social History Narrative  . No narrative on file    Family History  Problem Relation Age of Onset  . Hypertension Father   . Hyperlipidemia Neg Hx   . Heart attack Neg Hx   . Diabetes Neg Hx   . Sudden death Neg Hx     BP 128/86  Pulse 88  Temp(Src) 98.3 F (36.8 C) (Oral)  Ht 5' (1.524 m)  Wt 126 lb (57.153 kg)  BMI 24.61 kg/m2  Review of Systems  See HPI above.    Objective:   Physical Exam  Gen: NAD  R ankle: No swelling lateral ankle.  No bruising.  No deformity. FROM all planes with out pain, 5/5 strength all directions. No TTP lateral malleolus.  No TTP medial malleolus, talar dome, base 5th, navicular, fibular head, elsewhere about foot or ankle. Ant drawer and talar tilt negative.  Thompsons test negative. NV intact distally.     Assessment & Plan:  1. Right ankle distal fibula avulsion fracture - Weber A type - patient gets only occasional pains over distal fibula fracture site - examination is completely normal.  Did not repeat radiographs today as they wouldn't change management.  Again, despite prior radiographs  not showing bony union, without much pain, difficult to encourage patient to see a surgeon for ORIF.  Her goal is to be able to walk 2 miles at a time and we discussed increasing activity level by 10% each week from current level of 1 mile to fully reach that.  Discontinue nitro patches.  Discussed if pain persists, limits her in any way would have her consult with orthopedic surgeon.  Do not think bone stimulator is necessary at this point.

## 2011-05-21 NOTE — Assessment & Plan Note (Signed)
Right ankle distal fibula avulsion fracture - Weber A type - patient gets only occasional pains over distal fibula fracture site - examination is completely normal.  Did not repeat radiographs today as they wouldn't change management.  Again, despite prior radiographs not showing bony union, without much pain, difficult to encourage patient to see a surgeon for ORIF.  Her goal is to be able to walk 2 miles at a time and we discussed increasing activity level by 10% each week from current level of 1 mile to fully reach that.  Discontinue nitro patches.  Discussed if pain persists, limits her in any way would have her consult with orthopedic surgeon.  Do not think bone stimulator is necessary at this point.

## 2011-07-15 ENCOUNTER — Telehealth: Payer: Self-pay | Admitting: Oncology

## 2011-07-15 NOTE — Telephone Encounter (Signed)
called pt with appt aom °

## 2011-09-03 ENCOUNTER — Telehealth: Payer: Self-pay | Admitting: Oncology

## 2011-09-03 NOTE — Telephone Encounter (Signed)
pt called and l/m to cx the 9/24 appt and does not wish to r/s at this time     aom

## 2011-09-04 ENCOUNTER — Ambulatory Visit: Payer: BC Managed Care – PPO | Admitting: Oncology

## 2011-09-23 ENCOUNTER — Other Ambulatory Visit: Payer: Self-pay | Admitting: Family Medicine

## 2011-09-23 DIAGNOSIS — M79609 Pain in unspecified limb: Secondary | ICD-10-CM

## 2011-09-26 ENCOUNTER — Ambulatory Visit
Admission: RE | Admit: 2011-09-26 | Discharge: 2011-09-26 | Disposition: A | Discharge status: Home / Self Care | Payer: BC Managed Care – PPO | Source: Ambulatory Visit | Attending: Family Medicine | Admitting: Family Medicine

## 2011-09-26 DIAGNOSIS — M79609 Pain in unspecified limb: Secondary | ICD-10-CM

## 2011-10-09 ENCOUNTER — Ambulatory Visit: Payer: BC Managed Care – PPO | Admitting: Oncology

## 2011-11-01 ENCOUNTER — Other Ambulatory Visit: Payer: Self-pay | Admitting: Family Medicine

## 2011-11-01 DIAGNOSIS — Z1231 Encounter for screening mammogram for malignant neoplasm of breast: Secondary | ICD-10-CM

## 2011-12-18 ENCOUNTER — Ambulatory Visit
Admission: RE | Admit: 2011-12-18 | Discharge: 2011-12-18 | Disposition: A | Discharge status: Home / Self Care | Payer: BC Managed Care – PPO | Source: Ambulatory Visit | Attending: Family Medicine | Admitting: Family Medicine

## 2011-12-18 DIAGNOSIS — Z1231 Encounter for screening mammogram for malignant neoplasm of breast: Secondary | ICD-10-CM

## 2013-01-25 ENCOUNTER — Other Ambulatory Visit: Payer: Self-pay

## 2013-01-25 DIAGNOSIS — Z1231 Encounter for screening mammogram for malignant neoplasm of breast: Secondary | ICD-10-CM

## 2013-02-11 ENCOUNTER — Ambulatory Visit
Admission: RE | Admit: 2013-02-11 | Discharge: 2013-02-11 | Disposition: A | Discharge status: Home / Self Care | Payer: Self-pay | Source: Ambulatory Visit

## 2013-02-11 ENCOUNTER — Ambulatory Visit: Payer: BC Managed Care – PPO

## 2013-02-11 DIAGNOSIS — Z1231 Encounter for screening mammogram for malignant neoplasm of breast: Secondary | ICD-10-CM

## 2014-01-18 ENCOUNTER — Other Ambulatory Visit: Payer: Self-pay

## 2014-01-18 DIAGNOSIS — Z1231 Encounter for screening mammogram for malignant neoplasm of breast: Secondary | ICD-10-CM

## 2014-02-14 ENCOUNTER — Ambulatory Visit
Admission: RE | Admit: 2014-02-14 | Discharge: 2014-02-14 | Disposition: A | Discharge status: Home / Self Care | Payer: BLUE CROSS/BLUE SHIELD | Source: Ambulatory Visit

## 2014-02-14 DIAGNOSIS — Z1231 Encounter for screening mammogram for malignant neoplasm of breast: Secondary | ICD-10-CM

## 2014-03-04 ENCOUNTER — Other Ambulatory Visit: Payer: Self-pay | Admitting: Family Medicine

## 2015-01-11 ENCOUNTER — Other Ambulatory Visit: Payer: Self-pay

## 2015-01-11 DIAGNOSIS — Z1231 Encounter for screening mammogram for malignant neoplasm of breast: Secondary | ICD-10-CM

## 2015-02-16 ENCOUNTER — Ambulatory Visit
Admission: RE | Admit: 2015-02-16 | Discharge: 2015-02-16 | Disposition: A | Discharge status: Home / Self Care | Payer: BLUE CROSS/BLUE SHIELD | Source: Ambulatory Visit

## 2015-02-16 DIAGNOSIS — Z1231 Encounter for screening mammogram for malignant neoplasm of breast: Secondary | ICD-10-CM

## 2015-04-20 MED FILL — GAVILYTE-N SOLUTION: 420 | 1 days supply | Qty: 4000 | Fill #0

## 2015-04-20 MED FILL — SM GENTLE LAXATIVE EC 5 MG: 5 MG | 100 days supply | Qty: 100 | Fill #0

## 2015-05-05 ENCOUNTER — Other Ambulatory Visit: Payer: Self-pay | Admitting: Gastroenterology

## 2016-01-10 ENCOUNTER — Other Ambulatory Visit: Payer: Self-pay | Admitting: Family Medicine

## 2016-01-10 DIAGNOSIS — Z1231 Encounter for screening mammogram for malignant neoplasm of breast: Secondary | ICD-10-CM

## 2016-02-09 MED FILL — AZITHROMYCIN 250 MG TABLET: 250 | 5 days supply | Qty: 6 | Fill #0

## 2016-02-21 ENCOUNTER — Ambulatory Visit
Admission: RE | Admit: 2016-02-21 | Discharge: 2016-02-21 | Disposition: A | Discharge status: Home / Self Care | Payer: BLUE CROSS/BLUE SHIELD | Source: Ambulatory Visit | Attending: Family Medicine | Admitting: Family Medicine

## 2016-02-21 DIAGNOSIS — Z1231 Encounter for screening mammogram for malignant neoplasm of breast: Secondary | ICD-10-CM

## 2016-02-21 HISTORY — DX: Personal history of irradiation: Z92.3

## 2016-02-21 HISTORY — DX: Personal history of antineoplastic chemotherapy: Z92.21

## 2016-02-21 HISTORY — DX: Malignant neoplasm of unspecified site of unspecified female breast: C50.919

## 2016-09-11 MED FILL — MELOXICAM 15 MG TABLET: 15 | 30 days supply | Qty: 30 | Fill #0

## 2016-10-16 ENCOUNTER — Other Ambulatory Visit: Payer: Self-pay

## 2016-10-16 MED FILL — OXYCODONE-ACETAMINOPHEN 5-3: 5-325 | 7 days supply | Qty: 28 | Fill #0

## 2017-01-13 ENCOUNTER — Other Ambulatory Visit: Payer: Self-pay | Admitting: Family Medicine

## 2017-01-13 DIAGNOSIS — Z1231 Encounter for screening mammogram for malignant neoplasm of breast: Secondary | ICD-10-CM

## 2017-02-21 ENCOUNTER — Ambulatory Visit
Admission: RE | Admit: 2017-02-21 | Discharge: 2017-02-21 | Disposition: A | Discharge status: Home / Self Care | Payer: BLUE CROSS/BLUE SHIELD | Source: Ambulatory Visit | Attending: Family Medicine | Admitting: Family Medicine

## 2017-02-21 DIAGNOSIS — Z1231 Encounter for screening mammogram for malignant neoplasm of breast: Secondary | ICD-10-CM

## 2017-07-23 MED FILL — BESIVANCE 0.6% SUSP: 0.6 | 25 days supply | Qty: 5 | Fill #0

## 2017-07-23 MED FILL — LOTEMAX SM 0.38 % GEL: 0.38 | 30 days supply | Qty: 5 | Fill #0

## 2017-09-18 ENCOUNTER — Encounter (HOSPITAL_BASED_OUTPATIENT_CLINIC_OR_DEPARTMENT_OTHER): Payer: Self-pay

## 2017-09-18 ENCOUNTER — Emergency Department (HOSPITAL_BASED_OUTPATIENT_CLINIC_OR_DEPARTMENT_OTHER)
Admission: EM | Admit: 2017-09-18 | Discharge: 2017-09-18 | Disposition: A | Discharge status: Home / Self Care | Payer: No Typology Code available for payment source | Attending: Emergency Medicine | Admitting: Emergency Medicine

## 2017-09-18 DIAGNOSIS — Z79899 Other long term (current) drug therapy: Secondary | ICD-10-CM | POA: Diagnosis not present

## 2017-09-18 DIAGNOSIS — Z853 Personal history of malignant neoplasm of breast: Secondary | ICD-10-CM | POA: Insufficient documentation

## 2017-09-18 DIAGNOSIS — Z7982 Long term (current) use of aspirin: Secondary | ICD-10-CM | POA: Diagnosis not present

## 2017-09-18 DIAGNOSIS — R1031 Right lower quadrant pain: Secondary | ICD-10-CM | POA: Diagnosis present

## 2017-09-18 DIAGNOSIS — K4091 Unilateral inguinal hernia, without obstruction or gangrene, recurrent: Secondary | ICD-10-CM | POA: Insufficient documentation

## 2017-09-18 LAB — CBC WITH DIFFERENTIAL/PLATELET
BASOS ABS: 0 10*3/uL (ref 0.0–0.1)
BASOS PCT: 0 %
EOS ABS: 0.1 10*3/uL (ref 0.0–0.7)
EOS PCT: 2 %
HCT: 42.7 % (ref 36.0–46.0)
Hemoglobin: 14.2 g/dL (ref 12.0–15.0)
LYMPHS ABS: 1.3 10*3/uL (ref 0.7–4.0)
Lymphocytes Relative: 20 %
MCH: 30 pg (ref 26.0–34.0)
MCHC: 33.3 g/dL (ref 30.0–36.0)
MCV: 90.1 fL (ref 78.0–100.0)
Monocytes Absolute: 0.5 10*3/uL (ref 0.1–1.0)
Monocytes Relative: 7 %
NEUTROS PCT: 71 %
Neutro Abs: 4.9 10*3/uL (ref 1.7–7.7)
PLATELETS: 259 10*3/uL (ref 150–400)
RBC: 4.74 MIL/uL (ref 3.87–5.11)
RDW: 14.2 % (ref 11.5–15.5)
WBC: 6.8 10*3/uL (ref 4.0–10.5)

## 2017-09-18 LAB — COMPREHENSIVE METABOLIC PANEL
ALK PHOS: 51 U/L (ref 38–126)
ALT: 14 U/L (ref 0–44)
AST: 24 U/L (ref 15–41)
Albumin: 4.1 g/dL (ref 3.5–5.0)
Anion gap: 10 (ref 5–15)
BUN: 13 mg/dL (ref 8–23)
CALCIUM: 9.7 mg/dL (ref 8.9–10.3)
CO2: 27 mmol/L (ref 22–32)
CREATININE: 0.71 mg/dL (ref 0.44–1.00)
Chloride: 102 mmol/L (ref 98–111)
GFR calc non Af Amer: >60 mL/min (ref >60)
GLUCOSE: 103 mg/dL — AB (ref 70–99)
Potassium: 3.5 mmol/L (ref 3.5–5.1)
SODIUM: 139 mmol/L (ref 135–145)
Total Bilirubin: 0.8 mg/dL (ref 0.3–1.2)
Total Protein: 7.6 g/dL (ref 6.5–8.1)

## 2017-09-18 MED ORDER — SODIUM CHLORIDE 0.9 % IV BOLUS
500.0000 mL | Freq: Once | INTRAVENOUS | Status: AC
  Administered 2017-09-18: 500 mL via INTRAVENOUS

## 2017-09-18 MED ORDER — HYDROMORPHONE HCL 1 MG/ML IJ SOLN
1.0000 mg | Freq: Once | INTRAMUSCULAR | Status: AC
  Administered 2017-09-18: 1 mg via INTRAVENOUS
  Filled 2017-09-18: qty 1

## 2017-09-18 MED ORDER — ONDANSETRON HCL 4 MG/2ML IJ SOLN
4.0000 mg | Freq: Once | INTRAMUSCULAR | Status: AC
  Administered 2017-09-18: 4 mg via INTRAVENOUS
  Filled 2017-09-18: qty 2

## 2017-09-18 NOTE — ED Provider Notes (Signed)
[facility] EMERGENCY DEPARTMENT Provider Note   CSN: [id number] Arrival date & time: 09/18/17  0732     History   Chief Complaint Chief Complaint  Patient presents with  . hernia pain    HPI Teresa Terrell is a 67 y.o. female.  The history is provided by the patient. No language interpreter was used.   Teresa Terrell is a 67 y.o. female who presents to the Emergency Department complaining of hernia pain. He has a history of right inguinal hernia and notes that there is occasional pain off and on. About two hours ago she developed severe pain at the hernia site. Pain waxes and wanes but never entirely resolved. She states that occasionally the hernia will go back in but this does not frequently happen. It has not gone back in today. She denies any fevers, nausea, vomiting, dysuria, diarrhea, constipation. She has a history of hernia repair twice in the past at the same site. Past Medical History:  Diagnosis Date  . Breast cancer ([place]) 07/20/2001   Rt lumpectomy  . Personal history of chemotherapy 2003   No port  . Personal history of radiation therapy 2003   right    Patient Active Problem List   Diagnosis Date Noted  . Right ankle injury 02/01/2011    Past Surgical History:  Procedure Laterality Date  . ABDOMINAL HYSTERECTOMY    . BREAST BIOPSY Left 02/16/2004   benign excisional  . BREAST BIOPSY Left 2004   benign excisional  . BREAST BIOPSY Right 07/10/2001   Malignant [place] Core  . BREAST EXCISIONAL BIOPSY Left   . BREAST LUMPECTOMY Right 07/20/2001  . CATARACT EXTRACTION    . HERNIA REPAIR       OB History   None      Home Medications    Prior to Admission medications   Medication Sig Start Date End Date Taking? Authorizing Provider  aspirin 81 MG chewable tablet Chew 81 mg by mouth daily.    [provider]  Calcium Carbonate (CALCIUM 600 PO) Take by mouth.    [provider]  cholecalciferol (VITAMIN D) 400 UNITS TABS Take by  mouth.    [provider]    Family History Family History  Problem Relation Age of Onset  . Hypertension Father   . Hyperlipidemia Neg Hx   . Heart attack Neg Hx   . Diabetes Neg Hx   . Sudden death Neg Hx     Social History Social History   Tobacco Use  . Smoking status: Never Smoker  . Smokeless tobacco: Never Used  Substance Use Topics  . Alcohol use: Not Currently  . Drug use: Not on file     Allergies   Patient has no known allergies.   Review of Systems Review of Systems  All other systems reviewed and are negative.    Physical Exam Updated Vital Signs BP (!) 180/87 (BP Location: Right Arm)   Pulse 98   Temp 97.7 F (36.5 C) (Oral)   Resp 20   Ht 5' 0.25" (1.53 m)   Wt 50.8 kg   SpO2 100%   BMI 21.69 kg/m   Physical Exam  Constitutional: She is oriented to person, place, and time. She appears well-developed and well-nourished.  HENT:  Head: Normocephalic and atraumatic.  Cardiovascular: Normal rate and regular rhythm.  No murmur heard. Pulmonary/Chest: Effort normal and breath sounds normal. No respiratory distress.  Abdominal: Soft. There is no tenderness. There is no  rebound and no guarding.  Right inguinal hernia. Unable to reduce with constant direct pressure. Moderate local tenderness.  Musculoskeletal: She exhibits no edema or tenderness.  Neurological: She is alert and oriented to person, place, and time.  Skin: Skin is warm and dry.  Psychiatric: She has a normal mood and affect. Her behavior is normal.  Nursing note and vitals reviewed.    ED Treatments / Results  Labs (all labs ordered are listed, but only abnormal results are displayed) Labs Reviewed  COMPREHENSIVE METABOLIC PANEL  CBC WITH DIFFERENTIAL/PLATELET    EKG None  Radiology No results found.  Procedures Hernia reduction Date/Time: 09/18/2017 3:25 PM Performed by: Quintella Reichert, MD Authorized by: Quintella Reichert, MD  Consent: Verbal consent  obtained. Risks and benefits: risks, benefits and alternatives were discussed Consent given by: patient Patient identity confirmed: verbally with patient Local anesthesia used: no  Anesthesia: Local anesthesia used: no  Sedation: Patient sedated: no  Patient tolerance: Patient tolerated the procedure well with no immediate complications    (including critical care time)  Medications Ordered in ED Medications  HYDROmorphone (DILAUDID) injection 1 mg (has no administration in time range)  ondansetron (ZOFRAN) injection 4 mg (has no administration in time range)  sodium chloride 0.9 % bolus 500 mL (has no administration in time range)     Initial Impression / Assessment and Plan / ED Course  I have reviewed the triage vital signs and the nursing notes.  Pertinent labs & imaging results that were available during my care of the patient were reviewed by me and considered in my medical decision making (see chart for details).     Patient with known right inguinal hernia here for evaluation of pain at the hernia site. Initially unable to reduce hernia, but after pain medications and placing patient and turn to lumbar position hernia was able to be reduced was complete resolution in her pain. Discussed with PA, Brooke, on-call for general surgery we discussed the case with Dr. Lucia Gaskins. Plan to discharge home with close outpatient surgery follow-up. Counseled patient extensively on home care and return precautions for inguinal hernia. No evidence of incarceration or perforation based on history and examination post hernia reduction. Final Clinical Impressions(s) / ED Diagnoses   Final diagnoses:  None    ED Discharge Orders    None       Quintella Reichert, MD 09/18/17 1526

## 2017-09-18 NOTE — ED Triage Notes (Signed)
Pt c/o pain to known hernia to rt lower groin area an hour and a half ago

## 2017-09-18 NOTE — ED Notes (Signed)
No swelling noted to right groin.  Patient stated that  EDP.  pushed the hernia back to its place.  Denies pain at this time.

## 2017-09-23 ENCOUNTER — Ambulatory Visit: Payer: Self-pay | Admitting: General Surgery

## 2017-09-23 NOTE — H&P (Signed)
History of Present Illness Ralene Ok MD; 09/23/2017 11:24 AM) The patient is a 67 year old female who presents with an inguinal hernia. Referred by: Dr. Quintella Reichert Chief Complaint: Right inguinal hernia  Patient is a 67 year old female with approximately 5+ year history of recurrent right inguinal hernia. Patient states that during that time she was able to reduce the hernia with manual pressure. She states that last week she had an episode of incarceration to the hernia. Patient presented to the ER. After pain medication was administered EDP was able to reduce the hernia. Patient was referred for definitive treatment.  Patient does state that she feels hernia has gotten larger. It is painful at times, however she continues to be able to reduce it.  Patient has had a previous open right inguinal hernia repair with mesh as well as an open left inguinal hernia repair with mesh.   Past Surgical History Lindwood Coke, RN; 09/23/2017 11:10 AM) Breast Biopsy  Bilateral. Breast Mass; Local Excision  Right. Cataract Surgery  Bilateral. Colon Polyp Removal - Colonoscopy  Hysterectomy (not due to cancer) - Complete  Laparoscopic Inguinal Hernia Surgery  Bilateral. multiple Oral Surgery  Sentinel Lymph Node Biopsy  Ventral / Umbilical Hernia Surgery  Bilateral.  Diagnostic Studies History Lindwood Coke, RN; 09/23/2017 11:10 AM) Colonoscopy  1-5 years ago Pap Smear  >5 years ago  Allergies Lindwood Coke, RN; 09/23/2017 11:11 AM) No Known Drug Allergies [09/23/2017]: Allergies Reconciled   Medication History (Diane Herrin, RN; 09/23/2017 11:12 AM) Vitamin B12 (1000MCG Tablet ER, Oral) Active. Vitamin D (2000UNIT Capsule, Oral) Active. Calcium Carbonate (1250 (500 Ca)MG Tablet, Oral) Active. Systane (0.4-0.3% Solution, Ophthalmic) Active. Medications Reconciled  Social History Lindwood Coke, RN; 09/23/2017 11:10 AM) Alcohol use  Remotely quit alcohol use. No  caffeine use  No drug use  Tobacco use  Never smoker.  Family History Lindwood Coke, RN; 09/23/2017 11:10 AM) Arthritis  Brother. Cerebrovascular Accident  Family Members In General. Colon Polyps  Mother. Diabetes Mellitus  Family Members In General. Hypertension  Father. Prostate Cancer  Father.  Pregnancy / Birth History Lindwood Coke, RN; 09/23/2017 11:10 AM) Age at menarche  79 years. Age of menopause  7-55 Gravida  4 Length (months) of breastfeeding  7-12 Maternal age  33-30 Para  51  Other Problems Lindwood Coke, RN; 09/23/2017 11:10 AM) Arthritis  Hemorrhoids  High blood pressure  Inguinal Hernia  Umbilical Hernia Repair     Review of Systems Ralene Ok MD; 09/23/2017 11:22 AM) General Not Present- Appetite Loss, Chills, Fatigue, Fever, Night Sweats, Weight Gain and Weight Loss. Skin Not Present- Change in Wart/Mole, Dryness, Hives, Jaundice, New Lesions, Non-Healing Wounds, Rash and Ulcer. HEENT Present- Wears glasses/contact lenses. Not Present- Earache, Hearing Loss, Hoarseness, Nose Bleed, Oral Ulcers, Ringing in the Ears, Seasonal Allergies, Sinus Pain, Sore Throat, Visual Disturbances and Yellow Eyes. Respiratory Not Present- Bloody sputum, Chronic Cough, Difficulty Breathing, Snoring and Wheezing. Breast Not Present- Breast Mass, Breast Pain, Nipple Discharge and Skin Changes. Cardiovascular Not Present- Chest Pain, Difficulty Breathing Lying Down, Leg Cramps, Palpitations, Rapid Heart Rate, Shortness of Breath and Swelling of Extremities. Gastrointestinal Not Present- Abdominal Pain, Bloating, Bloody Stool, Change in Bowel Habits, Chronic diarrhea, Constipation, Difficulty Swallowing, Excessive gas, Gets full quickly at meals, Hemorrhoids, Indigestion, Nausea, Rectal Pain and Vomiting. Female Genitourinary Not Present- Frequency, Nocturia, Painful Urination, Pelvic Pain and Urgency. Musculoskeletal Not Present- Back Pain, Joint Pain, Joint  Stiffness, Muscle Pain, Muscle Weakness and Swelling of Extremities. Neurological Not Present- Decreased  Memory, Fainting, Headaches, Numbness, Seizures, Tingling, Tremor, Trouble walking and Weakness. Psychiatric Not Present- Anxiety, Bipolar, Change in Sleep Pattern, Depression, Fearful and Frequent crying. Endocrine Not Present- Cold Intolerance, Excessive Hunger, Hair Changes, Heat Intolerance, Hot flashes and New Diabetes. Hematology Not Present- Blood Thinners, Easy Bruising, Excessive bleeding, Gland problems, HIV and Persistent Infections. All other systems negative  Vitals (Diane Herrin RN; 09/23/2017 11:13 AM) 09/23/2017 11:12 AM Weight: 111.8 lb Height: 60in Body Surface Area: 1.46 m Body Mass Index: 21.83 kg/m  Temp.: 98.54F(Oral)  Pulse: 105 (Regular)  P.OX: 97% (Room air) BP: 140/82 (Sitting, Left Arm, Standard)       Physical Exam Ralene Ok MD; 09/23/2017 11:24 AM) The physical exam findings are as follows: Note:1. The patient will like to proceed to the operating room for laparoscopic right inguinal hernia repair with mesh.  2. I discussed with the patient the signs and symptoms of incarceration and strangulation and the need to proceed to the ER should they occur.  3. I discussed with the patient the risks and benefits of the procedure to include but not limited to: Infection, bleeding, damage to surrounding structures, possible need for further surgery, possible nerve pain, and possible recurrence. The patient was understanding and wishes to proceed.  Abdomen Inspection Hernias - Inguinal hernia - Right - Reducible.    Assessment & Plan Ralene Ok MD; 09/23/2017 11:24 AM) RECURRENT RIGHT INGUINAL HERNIA (K40.91) Impression: 67 year old female with a recurrent right inguinal hernia, incarcerated  1. The patient will like to proceed to the operating room for laparoscopic right inguinal hernia repair with mesh.  2. I discussed with the  patient the signs and symptoms of incarceration and strangulation and the need to proceed to the ER should they occur.  3. I discussed with the patient the risks and benefits of the procedure to include but not limited to: Infection, bleeding, damage to surrounding structures, possible need for further surgery, possible nerve pain, and possible recurrence. The patient was understanding and wishes to proceed.

## 2017-12-16 MED FILL — SHINGRIX 50 MCG SUS: 50 | 1 days supply | Qty: 1 | Fill #0

## 2018-01-13 ENCOUNTER — Other Ambulatory Visit: Payer: Self-pay | Admitting: Family Medicine

## 2018-01-13 DIAGNOSIS — Z1231 Encounter for screening mammogram for malignant neoplasm of breast: Secondary | ICD-10-CM

## 2018-02-23 ENCOUNTER — Ambulatory Visit
Admission: RE | Admit: 2018-02-23 | Discharge: 2018-02-23 | Disposition: A | Discharge status: Home / Self Care | Payer: No Typology Code available for payment source | Source: Ambulatory Visit | Attending: Family Medicine | Admitting: Family Medicine

## 2018-02-23 DIAGNOSIS — Z1231 Encounter for screening mammogram for malignant neoplasm of breast: Secondary | ICD-10-CM

## 2018-03-23 MED FILL — SHINGRIX 50 MCG SUS: 50 | 1 days supply | Qty: 1 | Fill #1

## 2018-03-31 MED FILL — metroNIDAZOLE 0.75 % CREA: 0.75 | 20 days supply | Qty: 45 | Fill #0

## 2019-01-18 ENCOUNTER — Other Ambulatory Visit: Payer: Self-pay | Admitting: Family Medicine

## 2019-01-18 DIAGNOSIS — Z1231 Encounter for screening mammogram for malignant neoplasm of breast: Secondary | ICD-10-CM

## 2019-02-25 ENCOUNTER — Other Ambulatory Visit: Payer: Self-pay

## 2019-02-25 ENCOUNTER — Ambulatory Visit: Payer: No Typology Code available for payment source

## 2019-02-25 ENCOUNTER — Ambulatory Visit
Admission: RE | Admit: 2019-02-25 | Discharge: 2019-02-25 | Disposition: A | Discharge status: Home / Self Care | Payer: No Typology Code available for payment source | Source: Ambulatory Visit | Attending: Family Medicine | Admitting: Family Medicine

## 2019-02-25 DIAGNOSIS — Z1231 Encounter for screening mammogram for malignant neoplasm of breast: Secondary | ICD-10-CM

## 2019-04-28 MED FILL — metroNIDAZOLE 0.75 % CREA: 0.75 | 30 days supply | Qty: 45 | Fill #0

## 2019-05-10 ENCOUNTER — Other Ambulatory Visit: Payer: Self-pay | Admitting: Family Medicine

## 2019-05-10 DIAGNOSIS — M8580 Other specified disorders of bone density and structure, unspecified site: Secondary | ICD-10-CM

## 2019-06-03 MED FILL — POLYMYXIN B/TMP EYE DROPS: 10000-0.1 | 50 days supply | Qty: 10 | Fill #0

## 2019-06-16 ENCOUNTER — Ambulatory Visit
Admission: RE | Admit: 2019-06-16 | Discharge: 2019-06-16 | Disposition: A | Discharge status: Home / Self Care | Payer: No Typology Code available for payment source | Source: Ambulatory Visit | Attending: Family Medicine | Admitting: Family Medicine

## 2019-06-16 ENCOUNTER — Other Ambulatory Visit: Payer: Self-pay

## 2019-06-16 DIAGNOSIS — M8580 Other specified disorders of bone density and structure, unspecified site: Secondary | ICD-10-CM

## 2020-01-18 ENCOUNTER — Other Ambulatory Visit: Payer: Self-pay | Admitting: Family Medicine

## 2020-01-18 DIAGNOSIS — Z1231 Encounter for screening mammogram for malignant neoplasm of breast: Secondary | ICD-10-CM

## 2020-02-28 ENCOUNTER — Other Ambulatory Visit: Payer: Self-pay

## 2020-02-28 ENCOUNTER — Ambulatory Visit
Admission: RE | Admit: 2020-02-28 | Discharge: 2020-02-28 | Disposition: A | Discharge status: Home / Self Care | Payer: No Typology Code available for payment source | Source: Ambulatory Visit | Attending: Family Medicine | Admitting: Family Medicine

## 2020-02-28 DIAGNOSIS — Z1231 Encounter for screening mammogram for malignant neoplasm of breast: Secondary | ICD-10-CM

## 2020-11-27 ENCOUNTER — Other Ambulatory Visit (HOSPITAL_BASED_OUTPATIENT_CLINIC_OR_DEPARTMENT_OTHER): Payer: Self-pay

## 2020-11-27 MED ORDER — PEG 3350-KCL-NABCB-NACL-NASULF 236 G PO SOLR
ORAL | 0 refills | Status: AC
Start: 2020-11-03 — End: ?
  Filled 2020-11-27: qty 4000, 1d supply, fill #0

## 2021-01-22 ENCOUNTER — Other Ambulatory Visit: Payer: Self-pay | Admitting: Family Medicine

## 2021-01-22 DIAGNOSIS — Z1231 Encounter for screening mammogram for malignant neoplasm of breast: Secondary | ICD-10-CM

## 2021-02-27 ENCOUNTER — Ambulatory Visit: Payer: No Typology Code available for payment source

## 2021-03-06 ENCOUNTER — Ambulatory Visit
Admission: RE | Admit: 2021-03-06 | Discharge: 2021-03-06 | Disposition: A | Discharge status: Home / Self Care | Payer: PRIVATE HEALTH INSURANCE | Source: Ambulatory Visit | Attending: Family Medicine | Admitting: Family Medicine

## 2021-03-06 DIAGNOSIS — Z1231 Encounter for screening mammogram for malignant neoplasm of breast: Secondary | ICD-10-CM

## 2021-05-21 ENCOUNTER — Other Ambulatory Visit (HOSPITAL_BASED_OUTPATIENT_CLINIC_OR_DEPARTMENT_OTHER): Payer: Self-pay

## 2021-05-21 MED ORDER — METRONIDAZOLE 0.75 % EX CREA
TOPICAL_CREAM | CUTANEOUS | 0 refills | Status: AC
  Filled 2021-05-21: qty 45, 30d supply, fill #0

## 2021-05-23 ENCOUNTER — Other Ambulatory Visit: Payer: Self-pay | Admitting: Family Medicine

## 2021-05-23 DIAGNOSIS — E7800 Pure hypercholesterolemia, unspecified: Secondary | ICD-10-CM

## 2021-06-18 ENCOUNTER — Other Ambulatory Visit: Payer: No Typology Code available for payment source

## 2021-06-26 ENCOUNTER — Ambulatory Visit
Admission: RE | Admit: 2021-06-26 | Discharge: 2021-06-26 | Disposition: A | Discharge status: Home / Self Care | Payer: No Typology Code available for payment source | Source: Ambulatory Visit | Attending: Family Medicine | Admitting: Family Medicine

## 2021-06-26 DIAGNOSIS — E7800 Pure hypercholesterolemia, unspecified: Secondary | ICD-10-CM

## 2021-08-10 ENCOUNTER — Other Ambulatory Visit (HOSPITAL_COMMUNITY): Payer: Self-pay

## 2022-01-23 ENCOUNTER — Other Ambulatory Visit: Payer: Self-pay | Admitting: Family Medicine

## 2022-01-23 DIAGNOSIS — Z1231 Encounter for screening mammogram for malignant neoplasm of breast: Secondary | ICD-10-CM

## 2022-03-19 ENCOUNTER — Ambulatory Visit
Admission: RE | Admit: 2022-03-19 | Discharge: 2022-03-19 | Disposition: A | Discharge status: Home / Self Care | Payer: Medicare Other | Source: Ambulatory Visit | Attending: Family Medicine | Admitting: Family Medicine

## 2022-03-19 DIAGNOSIS — Z1231 Encounter for screening mammogram for malignant neoplasm of breast: Secondary | ICD-10-CM

## 2022-05-20 ENCOUNTER — Encounter: Payer: Self-pay | Admitting: Family Medicine

## 2022-05-21 ENCOUNTER — Other Ambulatory Visit: Payer: Self-pay | Admitting: Family Medicine

## 2022-05-21 DIAGNOSIS — M859 Disorder of bone density and structure, unspecified: Secondary | ICD-10-CM

## 2022-05-24 ENCOUNTER — Ambulatory Visit
Admission: RE | Admit: 2022-05-24 | Discharge: 2022-05-24 | Disposition: A | Discharge status: Home / Self Care | Payer: Medicare Other | Source: Ambulatory Visit | Attending: Family Medicine | Admitting: Family Medicine

## 2022-05-24 DIAGNOSIS — M859 Disorder of bone density and structure, unspecified: Secondary | ICD-10-CM

## 2022-08-09 NOTE — Progress Notes (Unsigned)
[name] [address] [place] [address] Phone: [phone] Subjective:   Bruce Donath, am serving as a scribe for Dr. Antoine Primas.  I'm seeing this patient by the request  of:  Sigmund Hazel, MD  CC: right knee pain   [id number]  Teresa Terrell is a 72 y.o. female coming in with complaint of R knee pain. Patient states that she develops pain when walking which she does daily. Painful over anterio-medial aspect. Tried to do some exercises at home but her pain seemed to get worse. Prolonged sitting also increases her pain. Uses heat, ice and Tylenol.        Past Medical History:  Diagnosis Date   Breast cancer (HCC) 07/20/2001   Rt lumpectomy   Personal history of chemotherapy 2003   No port   Personal history of radiation therapy 2003   right   Past Surgical History:  Procedure Laterality Date   ABDOMINAL HYSTERECTOMY     BREAST BIOPSY Left 02/16/2004   benign excisional   BREAST BIOPSY Left 2004   benign excisional   BREAST BIOPSY Right 07/10/2001   Malignant [place] Core   BREAST EXCISIONAL BIOPSY Left    BREAST LUMPECTOMY Right 07/20/2001   CATARACT EXTRACTION     HERNIA REPAIR     Social History   Socioeconomic History   Marital status: Married    Spouse name: Not on file   Number of children: Not on file   Years of education: Not on file   Highest education level: Not on file  Occupational History   Not on file  Tobacco Use   Smoking status: Never   Smokeless tobacco: Never  Substance and Sexual Activity   Alcohol use: Not Currently   Drug use: Not on file   Sexual activity: Not on file  Other Topics Concern   Not on file  Social History Narrative   Not on file   Social Determinants of Health   Financial Resource Strain: Not on file  Food Insecurity: Not on file  Transportation Needs: Not on file  Physical Activity: Not on file  Stress: Not on file  Social Connections: Not on file   No Known  Allergies Family History  Problem Relation Age of Onset   Hypertension Father    Hyperlipidemia Neg Hx    Heart attack Neg Hx    Diabetes Neg Hx    Sudden death Neg Hx        Current Outpatient Medications (Analgesics):    aspirin 81 MG chewable tablet, Chew 81 mg by mouth daily.   Current Outpatient Medications (Other):    Calcium Carbonate (CALCIUM 600 PO), Take by mouth.   cholecalciferol (VITAMIN D) 400 UNITS TABS, Take by mouth.   metroNIDAZOLE (METROCREAM) 0.75 % cream, Apply externally to affected area twice a day.   polyethylene glycol (GOLYTELY) 236 g solution, Use as directed   Reviewed prior external information including notes and imaging from  primary care provider As well as notes that were available from care everywhere and other healthcare systems.  Past medical history, social, surgical and family history all reviewed in electronic medical record.  No pertanent information unless stated regarding to the chief complaint.   Review of Systems:  No headache, visual changes, nausea, vomiting, diarrhea, constipation, dizziness, abdominal pain, skin rash, fevers, chills, night sweats, weight loss, swollen lymph nodes, body aches, joint swelling, chest pain, shortness of breath, mood changes. POSITIVE muscle aches  Objective  Blood pressure 102/78, pulse 68, height 5' (1.524 m), weight 116 lb (52.6 kg), SpO2 97%.   General: No apparent distress alert and oriented x3 mood and affect normal, dressed appropriately.  HEENT: Pupils equal, extraocular movements intact  Respiratory: Patient's speak in full sentences and does not appear short of breath  Cardiovascular: No lower extremity edema, non tender, no erythema  Right knee exam shows no crepitus noted.  Some mild instability noted with valgus and varus force.  Patient does have lateral tracking of the patella noted.  Limited muscular skeletal ultrasound was performed and interpreted by Antoine Primas, M  Limited  ultrasound shows the patient does have hypoechoic changes in the patellofemoral area.  Patient also has significant narrowing of the medial compartment noted.  Septated Baker's cyst noted. Impression: Knee arthritis.    Impression and Recommendations:    The above documentation has been reviewed and is accurate and complete Teresa Saa, DO

## 2022-08-12 ENCOUNTER — Ambulatory Visit (INDEPENDENT_AMBULATORY_CARE_PROVIDER_SITE_OTHER): Payer: Medicare Other

## 2022-08-12 ENCOUNTER — Other Ambulatory Visit: Payer: Self-pay

## 2022-08-12 ENCOUNTER — Encounter: Payer: Self-pay | Admitting: Family Medicine

## 2022-08-12 ENCOUNTER — Ambulatory Visit (INDEPENDENT_AMBULATORY_CARE_PROVIDER_SITE_OTHER): Payer: Medicare Other | Admitting: Family Medicine

## 2022-08-12 VITALS — BP 102/78 | HR 68 | Ht 60.0 in | Wt 116.0 lb

## 2022-08-12 DIAGNOSIS — M25561 Pain in right knee: Secondary | ICD-10-CM

## 2022-08-12 DIAGNOSIS — M1711 Unilateral primary osteoarthritis, right knee: Secondary | ICD-10-CM | POA: Diagnosis not present

## 2022-08-12 NOTE — Assessment & Plan Note (Signed)
Patient is ambulatory with instability of the knee noted with abnormal thigh to calf ratio.  I do believe that an medial unloader brace with a Tru pull lite could be significantly beneficial for this individual.  She wants to hold on any injection but will consider it in the future.  We did discuss at great length about VMO strengthening, possibility of viscosupplementation as well as PRP injections.  Increase activity slowly.  Follow-up again in 6 to 8 weeks otherwise.

## 2022-08-12 NOTE — Patient Instructions (Signed)
Ryan Medial unloader with Tru pull lite Arnica lotion See me in 7-8 weeks Xray VMO exercises

## 2022-10-03 NOTE — Progress Notes (Signed)
[facility] [address] [place] [address] Phone: [phone] Subjective:   Bruce Donath, am serving as a scribe for Dr. Antoine Primas.  I'm seeing this patient by the request  of:  Sigmund Hazel, MD  CC: Right knee pain  [id number]  08/12/2022 Patient is ambulatory with instability of the knee noted with abnormal thigh to calf ratio.  I do believe that an medial unloader brace with a Tru pull lite could be significantly beneficial for this individual.  She wants to hold on any injection but will consider it in the future.  We did discuss at great length about VMO strengthening, possibility of viscosupplementation as well as PRP injections.  Increase activity slowly.  Follow-up again in 6 to 8 weeks otherwise.      Update 10/04/2022 Teresa Terrell is a 72 y.o. female coming in with complaint of R knee pain. Patient states that she is able to walk further with less pain. Brace is helpful. Still unable to fully flex knee.        Past Medical History:  Diagnosis Date   Breast cancer (HCC) 07/20/2001   Rt lumpectomy   Personal history of chemotherapy 2003   No port   Personal history of radiation therapy 2003   right   Past Surgical History:  Procedure Laterality Date   ABDOMINAL HYSTERECTOMY     BREAST BIOPSY Left 02/16/2004   benign excisional   BREAST BIOPSY Left 2004   benign excisional   BREAST BIOPSY Right 07/10/2001   Malignant [place] Core   BREAST EXCISIONAL BIOPSY Left    BREAST LUMPECTOMY Right 07/20/2001   CATARACT EXTRACTION     HERNIA REPAIR     Social History   Socioeconomic History   Marital status: Married    Spouse name: Not on file   Number of children: Not on file   Years of education: Not on file   Highest education level: Not on file  Occupational History   Not on file  Tobacco Use   Smoking status: Never   Smokeless tobacco: Never  Substance and Sexual Activity   Alcohol use: Not Currently   Drug  use: Not on file   Sexual activity: Not on file  Other Topics Concern   Not on file  Social History Narrative   Not on file   Social Determinants of Health   Financial Resource Strain: Not on file  Food Insecurity: Not on file  Transportation Needs: Not on file  Physical Activity: Not on file  Stress: Not on file  Social Connections: Not on file   No Known Allergies Family History  Problem Relation Age of Onset   Hypertension Father    Hyperlipidemia Neg Hx    Heart attack Neg Hx    Diabetes Neg Hx    Sudden death Neg Hx        Current Outpatient Medications (Analgesics):    aspirin 81 MG chewable tablet, Chew 81 mg by mouth daily.   Current Outpatient Medications (Other):    Calcium Carbonate (CALCIUM 600 PO), Take by mouth.   cholecalciferol (VITAMIN D) 400 UNITS TABS, Take by mouth.   metroNIDAZOLE (METROCREAM) 0.75 % cream, Apply externally to affected area twice a day.   polyethylene glycol (GOLYTELY) 236 g solution, Use as directed     Objective  Blood pressure 108/74, pulse 68, height 5' (1.524 m), weight 123 lb (55.8 kg), SpO2 98%.   General: No apparent distress alert and  oriented x3 mood and affect normal, dressed appropriately.  HEENT: Pupils equal, extraocular movements intact  Respiratory: Patient's speak in full sentences and does not appear short of breath  Cardiovascular: No lower extremity edema, non tender, no erythema  Right knee does have a trace amount of swelling noted.  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    Impression and Recommendations:     The above documentation has been reviewed and is accurate and complete Judi Saa, DO

## 2022-10-07 ENCOUNTER — Encounter: Payer: Self-pay | Admitting: Family Medicine

## 2022-10-07 ENCOUNTER — Ambulatory Visit (INDEPENDENT_AMBULATORY_CARE_PROVIDER_SITE_OTHER): Payer: Medicare Other | Admitting: Family Medicine

## 2022-10-07 VITALS — BP 108/74 | HR 68 | Ht 60.0 in | Wt 123.0 lb

## 2022-10-07 DIAGNOSIS — M1711 Unilateral primary osteoarthritis, right knee: Secondary | ICD-10-CM | POA: Diagnosis not present

## 2022-10-07 NOTE — Patient Instructions (Signed)
You are great  Stay active  Keep increasing activity  Have fun with the hubby walking! Maybe ice after a lot of activity to help swelling See me again in 3 months

## 2022-10-07 NOTE — Assessment & Plan Note (Signed)
Chronic but stable at this moment.  Discussed icing regimen and home exercises, discussed avoiding certain activities.  Increase activity slowly.  Follow-up with me again in 6 to 8 weeks otherwise.  Patient should do well with conservative therapy but if worsening consider steroid injection, viscosupplementation, icing regimen otherwise.

## 2022-12-19 NOTE — Progress Notes (Unsigned)
[facility] [address] [place] [address] Phone: [phone] Subjective:    I'm seeing this patient by the request  of:  Sigmund Hazel, MD  CC:   [id number]  10/07/2022 Chronic but stable at this moment.  Discussed icing regimen and home exercises, discussed avoiding certain activities.  Increase activity slowly.  Follow-up with me again in 6 to 8 weeks otherwise.  Patient should do well with conservative therapy but if worsening consider steroid injection, viscosupplementation, icing regimen otherwise.     Updated 12/24/2022 Teresa Terrell is a 72 y.o. female coming in with complaint of R knee pain      Past Medical History:  Diagnosis Date   Breast cancer (HCC) 07/20/2001   Rt lumpectomy   Personal history of chemotherapy 2003   No port   Personal history of radiation therapy 2003   right   Past Surgical History:  Procedure Laterality Date   ABDOMINAL HYSTERECTOMY     BREAST BIOPSY Left 02/16/2004   benign excisional   BREAST BIOPSY Left 2004   benign excisional   BREAST BIOPSY Right 07/10/2001   Malignant [place] Core   BREAST EXCISIONAL BIOPSY Left    BREAST LUMPECTOMY Right 07/20/2001   CATARACT EXTRACTION     HERNIA REPAIR     Social History   Socioeconomic History   Marital status: Married    Spouse name: Not on file   Number of children: Not on file   Years of education: Not on file   Highest education level: Not on file  Occupational History   Not on file  Tobacco Use   Smoking status: Never   Smokeless tobacco: Never  Substance and Sexual Activity   Alcohol use: Not Currently   Drug use: Not on file   Sexual activity: Not on file  Other Topics Concern   Not on file  Social History Narrative   Not on file   Social Determinants of Health   Financial Resource Strain: Not on file  Food Insecurity: Not on file  Transportation Needs: Not on file  Physical Activity: Not on file  Stress: Not on file   Social Connections: Not on file   No Known Allergies Family History  Problem Relation Age of Onset   Hypertension Father    Hyperlipidemia Neg Hx    Heart attack Neg Hx    Diabetes Neg Hx    Sudden death Neg Hx        Current Outpatient Medications (Analgesics):    aspirin 81 MG chewable tablet, Chew 81 mg by mouth daily.   Current Outpatient Medications (Other):    Calcium Carbonate (CALCIUM 600 PO), Take by mouth.   cholecalciferol (VITAMIN D) 400 UNITS TABS, Take by mouth.   metroNIDAZOLE (METROCREAM) 0.75 % cream, Apply externally to affected area twice a day.   polyethylene glycol (GOLYTELY) 236 g solution, Use as directed   Reviewed prior external information including notes and imaging from  primary care provider As well as notes that were available from care everywhere and other healthcare systems.  Past medical history, social, surgical and family history all reviewed in electronic medical record.  No pertanent information unless stated regarding to the chief complaint.   Review of Systems:  No headache, visual changes, nausea, vomiting, diarrhea, constipation, dizziness, abdominal pain, skin rash, fevers, chills, night sweats, weight loss, swollen lymph nodes, body aches, joint swelling, chest pain, shortness of breath, mood changes. POSITIVE muscle aches  Objective  There were no vitals taken for this visit.   General: No apparent distress alert and oriented x3 mood and affect normal, dressed appropriately.  HEENT: Pupils equal, extraocular movements intact  Respiratory: Patient's speak in full sentences and does not appear short of breath  Cardiovascular: No lower extremity edema, non tender, no erythema      Impression and Recommendations:

## 2022-12-24 ENCOUNTER — Ambulatory Visit: Payer: Medicare Other | Admitting: Family Medicine

## 2022-12-25 NOTE — Progress Notes (Unsigned)
[facility] [address] [place] [address] Phone: [phone] Subjective:   Teresa Terrell, am serving as a scribe for Dr. Antoine Terrell.  I'm seeing this patient by the request  of:  Teresa Hazel, MD  CC: Right knee pain  [id number]  10/07/2022 Chronic but stable at this moment.  Discussed icing regimen and home exercises, discussed avoiding certain activities.  Increase activity slowly.  Follow-up with me again in 6 to 8 weeks otherwise.  Patient should do well with conservative therapy but if worsening consider steroid injection, viscosupplementation, icing regimen otherwise.     Update 12/26/2022 Teresa Terrell is a 72 y.o. female coming in with complaint of R knee pain. Patient states that she is the same as last visit. Wears brace when walking and she is able to go further when wearing brace. Has intermittent bouts of sharp pain in anterior aspect. Last flare was while laying in bed.      Past Medical History:  Diagnosis Date   Breast cancer (HCC) 07/20/2001   Rt lumpectomy   Personal history of chemotherapy 2003   No port   Personal history of radiation therapy 2003   right   Past Surgical History:  Procedure Laterality Date   ABDOMINAL HYSTERECTOMY     BREAST BIOPSY Left 02/16/2004   benign excisional   BREAST BIOPSY Left 2004   benign excisional   BREAST BIOPSY Right 07/10/2001   Malignant [place] Core   BREAST EXCISIONAL BIOPSY Left    BREAST LUMPECTOMY Right 07/20/2001   CATARACT EXTRACTION     HERNIA REPAIR     Social History   Socioeconomic History   Marital status: Married    Spouse name: Not on file   Number of children: Not on file   Years of education: Not on file   Highest education level: Not on file  Occupational History   Not on file  Tobacco Use   Smoking status: Never   Smokeless tobacco: Never  Substance and Sexual Activity   Alcohol use: Not Currently   Drug use: Not on file   Sexual activity:  Not on file  Other Topics Concern   Not on file  Social History Narrative   Not on file   Social Drivers of Health   Financial Resource Strain: Not on file  Food Insecurity: Not on file  Transportation Needs: Not on file  Physical Activity: Not on file  Stress: Not on file  Social Connections: Not on file   No Known Allergies Family History  Problem Relation Age of Onset   Hypertension Father    Hyperlipidemia Neg Hx    Heart attack Neg Hx    Diabetes Neg Hx    Sudden death Neg Hx        Current Outpatient Medications (Analgesics):    aspirin 81 MG chewable tablet, Chew 81 mg by mouth daily.   Current Outpatient Medications (Other):    Calcium Carbonate (CALCIUM 600 PO), Take by mouth.   cholecalciferol (VITAMIN D) 400 UNITS TABS, Take by mouth.   metroNIDAZOLE (METROCREAM) 0.75 % cream, Apply externally to affected area twice a day.   polyethylene glycol (GOLYTELY) 236 g solution, Use as directed   Reviewed prior external information including notes and imaging from  primary care provider As well as notes that were available from care everywhere and other healthcare systems.  Past medical history, social, surgical and family history all reviewed in electronic medical record.  No  pertanent information unless stated regarding to the chief complaint.   Review of Systems:  No headache, visual changes, nausea, vomiting, diarrhea, constipation, dizziness, abdominal pain, skin rash, fevers, chills, night sweats, weight loss, swollen lymph nodes, body aches, joint swelling, chest pain, shortness of breath, mood changes. POSITIVE muscle aches  Objective  Blood pressure 106/72, pulse 67, height 5' (1.524 m), weight 117 lb (53.1 kg), SpO2 98%.   General: No apparent distress alert and oriented x3 mood and affect normal, dressed appropriately.  HEENT: Pupils equal, extraocular movements intact  Respiratory: Patient's speak in full sentences and does not appear short of  breath  Cardiovascular: No lower extremity edema, non tender, no erythema  Right knee does have arthritic changes noted.  Trace effusion noted lacking the last 5 degrees of extension and flexion.  After informed written and verbal consent, patient was seated on exam table. Right knee was prepped with alcohol swab and utilizing anterolateral approach, patient's right knee space was injected with 4:1  marcaine 0.5%: Kenalog 40mg /dL. Patient tolerated the procedure well without immediate complications.   Impression and Recommendations:     The above documentation has been reviewed and is accurate and complete Teresa Saa, DO

## 2022-12-26 ENCOUNTER — Encounter: Payer: Self-pay | Admitting: Family Medicine

## 2022-12-26 ENCOUNTER — Ambulatory Visit: Payer: Medicare Other | Admitting: Family Medicine

## 2022-12-26 VITALS — BP 106/72 | HR 67 | Ht 60.0 in | Wt 117.0 lb

## 2022-12-26 DIAGNOSIS — M1711 Unilateral primary osteoarthritis, right knee: Secondary | ICD-10-CM | POA: Diagnosis not present

## 2022-12-26 NOTE — Assessment & Plan Note (Signed)
Chronic problem with worsening symptoms.  Continue to give her difficulty.  We discussed different treatment options including steroid injection at great length as well as viscosupplementation and PRP.  Patient elected to try a steroid injection to see how patient would respond.  Discussed icing regimen and home exercises, discussed which activities to do and which ones to avoid.  Follow-up with me again in 6 to 8 weeks.  At that time we will see how patient is responding to the conservative therapy.

## 2022-12-26 NOTE — Patient Instructions (Addendum)
Injected R knee today Read about PRP and visco See me again in 3 months

## 2023-02-05 ENCOUNTER — Other Ambulatory Visit: Payer: Self-pay | Admitting: Family Medicine

## 2023-02-05 DIAGNOSIS — Z1231 Encounter for screening mammogram for malignant neoplasm of breast: Secondary | ICD-10-CM

## 2023-03-21 ENCOUNTER — Ambulatory Visit
Admission: RE | Admit: 2023-03-21 | Discharge: 2023-03-21 | Disposition: A | Discharge status: Home / Self Care | Payer: Medicare Other | Source: Ambulatory Visit | Attending: Family Medicine | Admitting: Family Medicine

## 2023-03-21 DIAGNOSIS — Z1231 Encounter for screening mammogram for malignant neoplasm of breast: Secondary | ICD-10-CM

## 2023-03-26 ENCOUNTER — Ambulatory Visit: Payer: Medicare Other | Admitting: Family Medicine

## 2023-04-02 IMAGING — CT CT CARDIAC CORONARY ARTERY CALCIUM SCORE
3 series · 12 of 20 positions shown, 14 images · non-contrast
Comparison: None Available.

CLINICAL DATA: 71-year-old white female with elevated cholesterol.

EXAM:
CT CARDIAC CORONARY ARTERY CALCIUM SCORE
TECHNIQUE: Non-contrast imaging through the heart was performed using
prospective ECG gating. Image post processing was performed on an
independent workstation, allowing for quantitative analysis of the
heart and coronary arteries. Note that this exam targets the heart
and the chest was not imaged in its entirety.

[Series 2: calcium scoring 2.00 qr36 bestdiast 70% hrt calciu · axial · 0.35mm/px · z∈[+1365,+1393]mm · 2 of 70 slices shown]
[im 14/70  vessel]
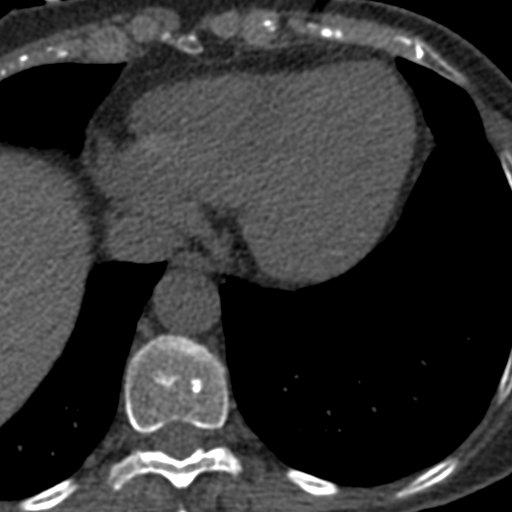
[im 28/70  vessel]
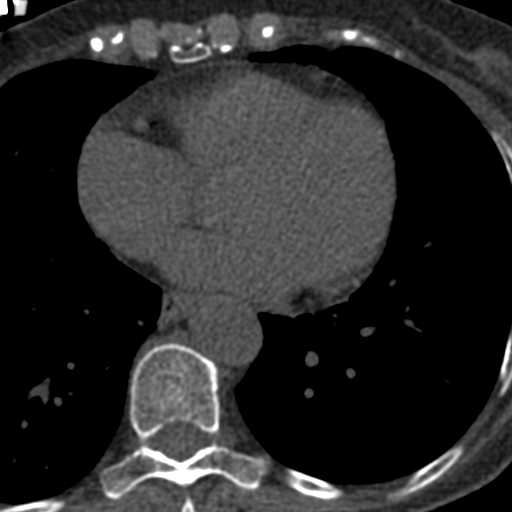

[Series 3: calcium scoring 2.00 br40 bestdiast 70% axial · axial · 0.51mm/px · z∈[+1361,+1453]mm · 5 of 70 slices shown, 7 images]
[im 12/70  vessel]
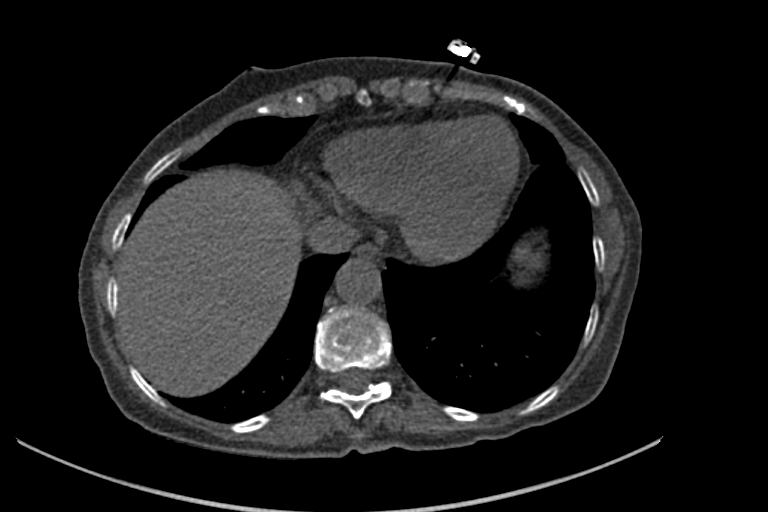
[im 12/70  lung]
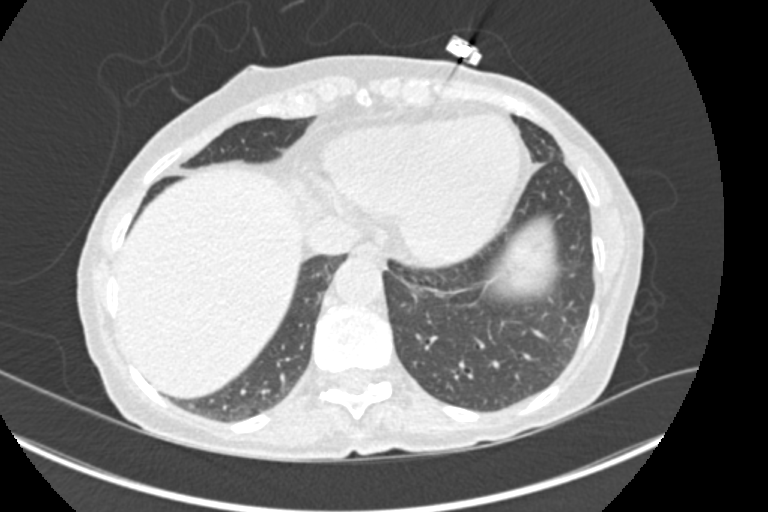
[im 24/70  vessel]
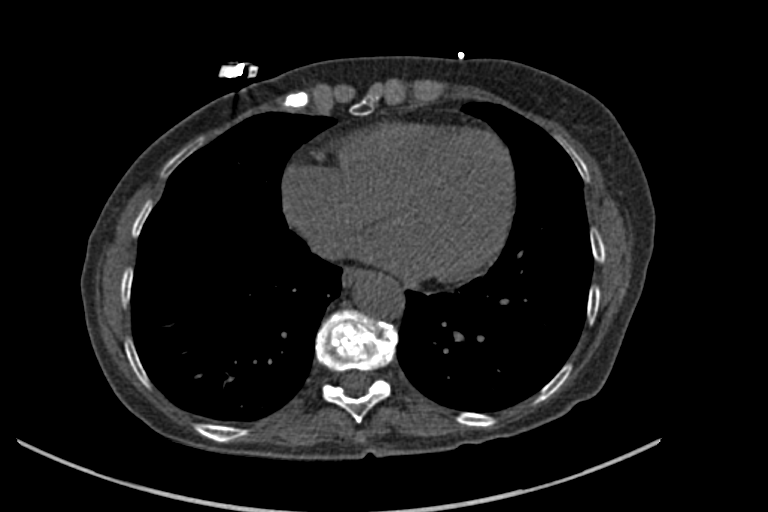
[im 35/70  vessel]
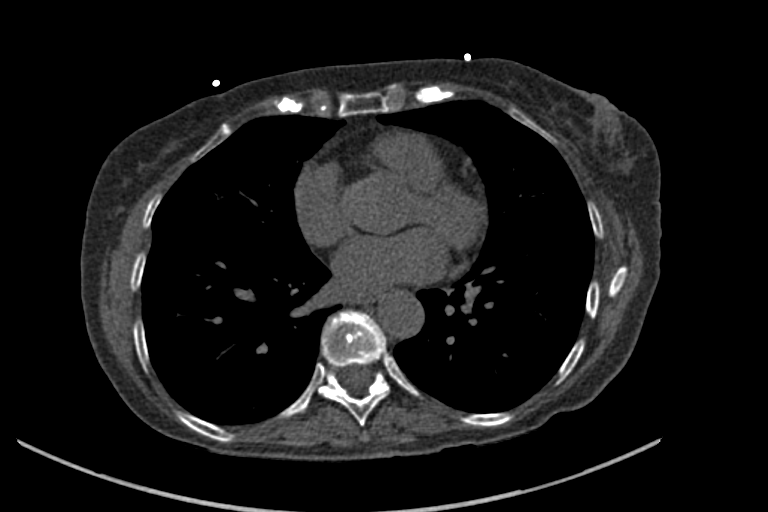
[im 47/70  vessel]
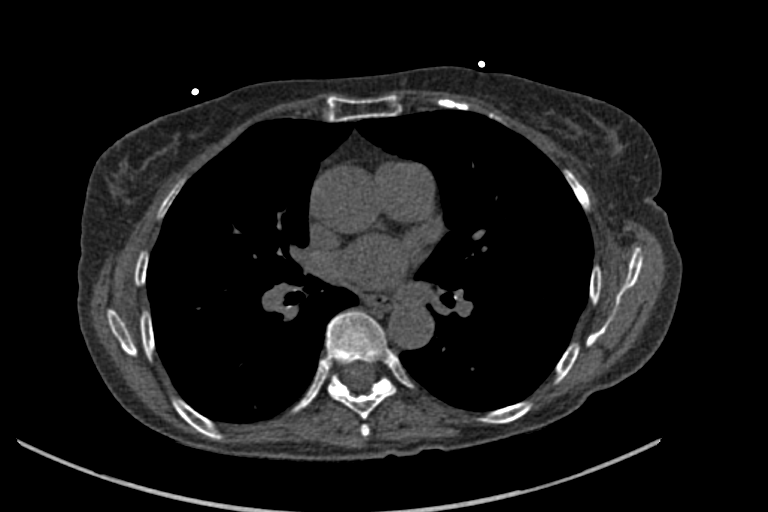
[im 58/70  vessel]
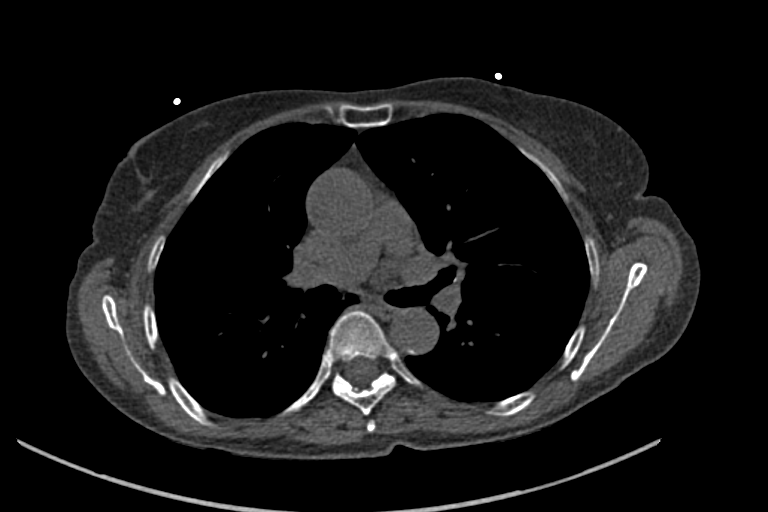
[im 58/70  lung]
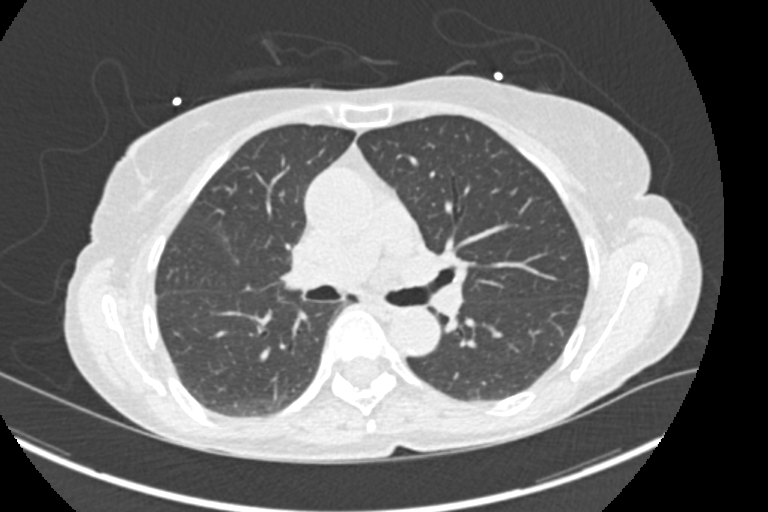

[Series 9: calcium scoring 2.00 br60 bestdiast 70% lungs · axial · 0.51mm/px · z∈[+1361,+1453]mm · 5 of 70 slices shown]
[im 12/70  vessel]
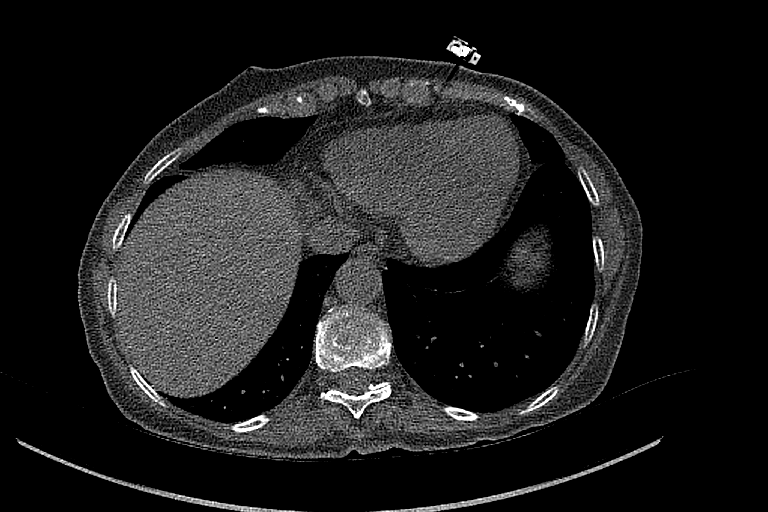
[im 24/70  vessel]
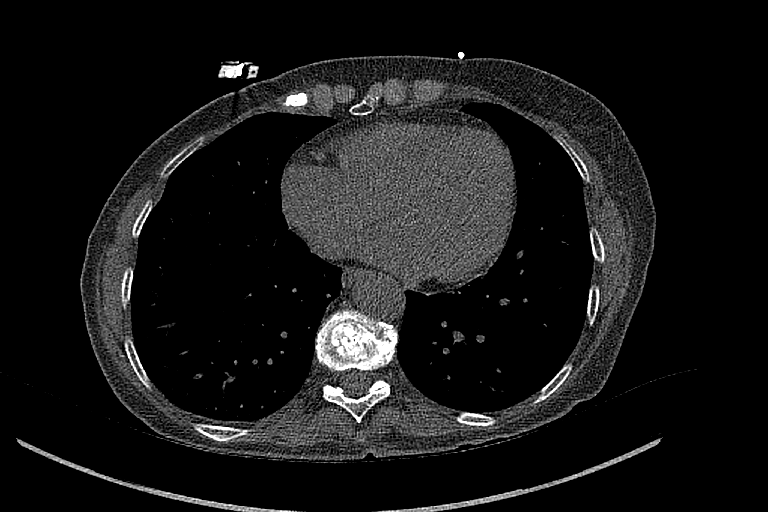
[im 35/70  vessel]
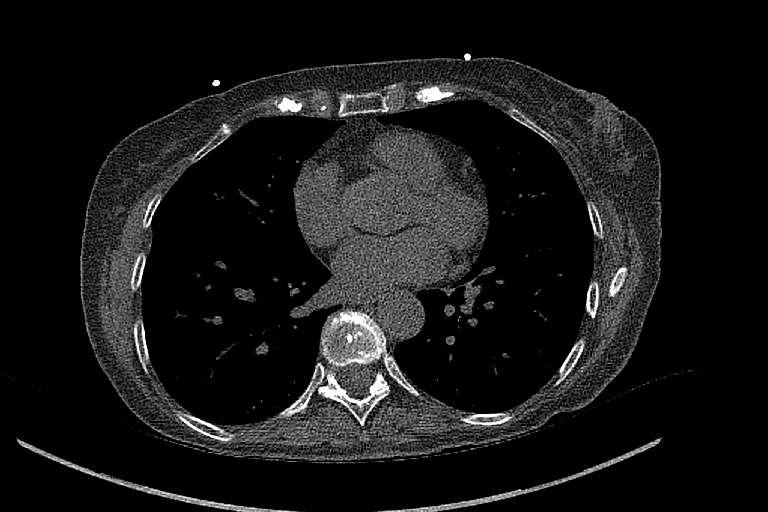
[im 47/70  vessel]
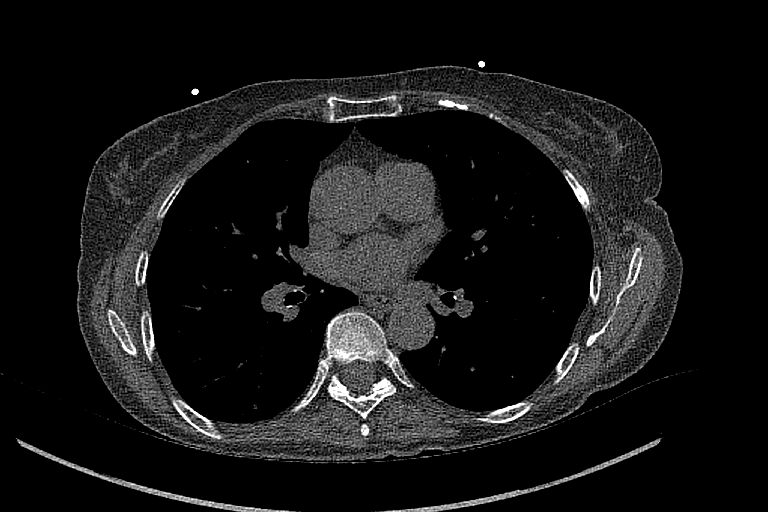
[im 58/70  vessel]
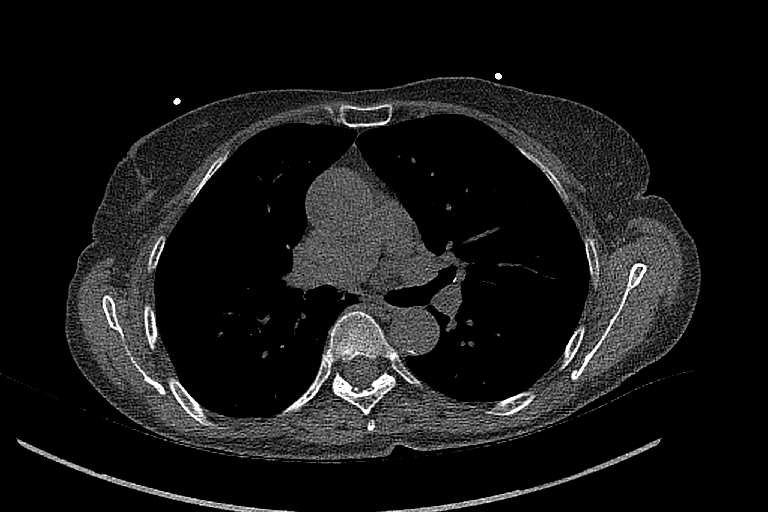

[12 of 20 positions shown; findings below may reference images not displayed]

FINDINGS: CORONARY CALCIUM SCORES:

Left Main: 0

LAD:

LCx:

RCA:

Total Agatston Score:

[HOSPITAL] percentile: 63

AORTA MEASUREMENTS:

Ascending Aorta: 33 mm

Descending Aorta: 26 mm

OTHER FINDINGS:

Atherosclerotic calcifications involving the thoracic aorta. There
are aortic calcifications at the origin of the left main coronary
artery that are not included in the coronary calcium score. Heart
size is normal. Visualized mediastinal structures are normal. Images
of the upper abdomen are unremarkable. Small branching densities in
the right middle lobe on sequence 9 image 31 are likely post
inflammatory. No large areas of airspace disease or consolidation in
the visualized lungs. No pleural effusions. No acute bone
abnormality.
IMPRESSION: 1. Coronary calcium score is 77.2 and this is at percentile 63 for
subjects of the same age, gender and ethnicity.
2.  Aortic Atherosclerosis ([id number]).
